# Patient Record
Sex: Male | Born: 1965 | Race: White | Hispanic: No | Marital: Married | Smoking: Never smoker
Health system: Southern US, Community
[De-identification: ages and names within clinical notes are randomized; demographics above are authoritative.]

## PROBLEM LIST (undated history)

## (undated) DIAGNOSIS — M25522 Pain in left elbow: Secondary | ICD-10-CM

## (undated) DIAGNOSIS — J309 Allergic rhinitis, unspecified: Secondary | ICD-10-CM

## (undated) DIAGNOSIS — H409 Unspecified glaucoma: Secondary | ICD-10-CM

## (undated) DIAGNOSIS — T7840XA Allergy, unspecified, initial encounter: Secondary | ICD-10-CM

## (undated) DIAGNOSIS — K219 Gastro-esophageal reflux disease without esophagitis: Secondary | ICD-10-CM

## (undated) DIAGNOSIS — K589 Irritable bowel syndrome without diarrhea: Secondary | ICD-10-CM

## (undated) HISTORY — DX: Unspecified glaucoma: H40.9

## (undated) HISTORY — DX: Allergic rhinitis, unspecified: J30.9

## (undated) HISTORY — DX: Allergy, unspecified, initial encounter: T78.40XA

## (undated) HISTORY — DX: Pain in left elbow: M25.522

## (undated) HISTORY — DX: Gastro-esophageal reflux disease without esophagitis: K21.9

---

## 2004-10-06 ENCOUNTER — Ambulatory Visit: Payer: Self-pay | Admitting: Internal Medicine

## 2005-05-11 ENCOUNTER — Ambulatory Visit: Payer: Self-pay | Admitting: Internal Medicine

## 2005-05-15 ENCOUNTER — Ambulatory Visit: Payer: Self-pay | Admitting: Internal Medicine

## 2005-07-24 ENCOUNTER — Ambulatory Visit: Payer: Self-pay | Admitting: Internal Medicine

## 2008-02-07 ENCOUNTER — Ambulatory Visit: Payer: Self-pay | Admitting: Internal Medicine

## 2008-02-07 DIAGNOSIS — M25569 Pain in unspecified knee: Secondary | ICD-10-CM | POA: Insufficient documentation

## 2009-04-24 ENCOUNTER — Ambulatory Visit: Payer: Self-pay | Admitting: Internal Medicine

## 2009-04-24 DIAGNOSIS — M25519 Pain in unspecified shoulder: Secondary | ICD-10-CM

## 2009-04-24 DIAGNOSIS — M7710 Lateral epicondylitis, unspecified elbow: Secondary | ICD-10-CM | POA: Insufficient documentation

## 2009-04-24 DIAGNOSIS — R079 Chest pain, unspecified: Secondary | ICD-10-CM

## 2009-04-24 DIAGNOSIS — M7711 Lateral epicondylitis, right elbow: Secondary | ICD-10-CM | POA: Insufficient documentation

## 2009-04-24 DIAGNOSIS — M21619 Bunion of unspecified foot: Secondary | ICD-10-CM

## 2009-05-04 DIAGNOSIS — M25522 Pain in left elbow: Secondary | ICD-10-CM

## 2009-05-04 HISTORY — DX: Pain in left elbow: M25.522

## 2009-06-17 ENCOUNTER — Ambulatory Visit: Payer: Self-pay | Admitting: Internal Medicine

## 2009-06-17 DIAGNOSIS — J0130 Acute sphenoidal sinusitis, unspecified: Secondary | ICD-10-CM | POA: Insufficient documentation

## 2009-06-17 DIAGNOSIS — Z87891 Personal history of nicotine dependence: Secondary | ICD-10-CM

## 2009-06-17 DIAGNOSIS — J209 Acute bronchitis, unspecified: Secondary | ICD-10-CM | POA: Insufficient documentation

## 2009-07-03 ENCOUNTER — Ambulatory Visit: Payer: Self-pay | Admitting: Internal Medicine

## 2009-08-01 ENCOUNTER — Telehealth: Payer: Self-pay | Admitting: Internal Medicine

## 2009-08-05 ENCOUNTER — Ambulatory Visit: Payer: Self-pay | Admitting: Internal Medicine

## 2009-08-05 DIAGNOSIS — M25529 Pain in unspecified elbow: Secondary | ICD-10-CM

## 2010-02-28 ENCOUNTER — Telehealth: Payer: Self-pay | Admitting: Internal Medicine

## 2010-04-07 ENCOUNTER — Encounter: Payer: Self-pay | Admitting: Internal Medicine

## 2010-04-07 ENCOUNTER — Ambulatory Visit: Payer: Self-pay | Admitting: Internal Medicine

## 2010-04-07 ENCOUNTER — Telehealth: Payer: Self-pay | Admitting: Internal Medicine

## 2010-04-07 DIAGNOSIS — M79609 Pain in unspecified limb: Secondary | ICD-10-CM

## 2010-05-14 ENCOUNTER — Telehealth: Payer: Self-pay | Admitting: Internal Medicine

## 2010-05-14 ENCOUNTER — Ambulatory Visit
Admission: RE | Admit: 2010-05-14 | Discharge: 2010-05-14 | Payer: Self-pay | Source: Home / Self Care | Attending: Internal Medicine | Admitting: Internal Medicine

## 2010-05-14 DIAGNOSIS — J069 Acute upper respiratory infection, unspecified: Secondary | ICD-10-CM | POA: Insufficient documentation

## 2010-06-03 NOTE — Assessment & Plan Note (Signed)
Summary: pain right elbow/add per sarah/cd   Vital Signs:  Patient profile:   45 year old male Height:      69 inches Weight:      178 pounds BMI:     26.38 O2 Sat:      98 % on Room air Temp:     97.1 degrees F oral Pulse rate:   56 / minute BP sitting:   128 / 82  (left arm) Cuff size:   regular  Vitals Entered By: Bill Salinas CMA (August 05, 2009 8:09 AM)  O2 Flow:  Room air  Procedure Note Last Tetanus: Td (02/07/2008)  Injections: The patient complains of pain. Indication: chronic pain Consent signed: yes  Procedure # 1: joint injection    Region: lateral    Location:  L elbow lat epic.    Technique: 24 g needle    Medication: 20 mg depomedrol    Anesthesia: 2.0 ml 1% lidocaine w/o epinephrine    Comment: Risks including but not limited by incomplete procedure, bleeding, infection, recurrence were discussed with the patient. Consent form was signed. Tolerated well. Complicatons - none. Good pain relief following the procedure.   Cleaned and prepped with: alcohol and betadine Wound dressing: bandaid and pressure dressing  CC: pt here with c/o pain in left elbow/ ab   CC:  pt here with c/o pain in left elbow/ ab.  History of Present Illness: C/o L elbow pain - bad w/exertion x weeks. Not better w/Pennsaid and Naproxen.  Current Medications (verified): 1)  Flexeril 5 Mg Tabs (Cyclobenzaprine Hcl) .Marland Kitchen.. 1 By Mouth Three Times A Day As Needed 2)  Naproxen 500 Mg Tabs (Naproxen) .Marland Kitchen.. 1 By Mouth Two Times A Day As Needed Pain 3)  Vitamin D 1000 Unit Tabs (Cholecalciferol) .Marland Kitchen.. 1 By Mouth Qd 4)  Promethazine-Codeine 6.25-10 Mg/14ml Syrp (Promethazine-Codeine) .... 5-10 Ml By Mouth Q Id As Needed Cough 5)  Pennsaid 1.5 % Soln (Diclofenac Sodium) .... 3-5 Gtt On Skin Three Times A Day For Pain 6)  Hydrocodone-Acetaminophen 5-325 Mg Tabs (Hydrocodone-Acetaminophen) .Marland Kitchen.. 1-2 By Mouth Two Times A Day As Needed Pain  Allergies: No Known Drug Allergies  Physical  Exam  General:  R handed NAD Msk:  L lat epic is tender Extremities:  No clubbing, cyanosis, edema, or deformity noted with normal full range of motion of all joints.   Neurologic:  No cranial nerve deficits noted. Station and gait are normal. Plantar reflexes are down-going bilaterally. DTRs are symmetrical throughout. Sensory, motor and coordinative functions appear intact.   Impression & Recommendations:  Problem # 1:  ELBOW PAIN (ICD-719.42) L Assessment Deteriorated Options to treat discussed. He wanted to have an injection done. Orders: Ace Wraps 3-5 in/yard  (K7425) Joint Aspirate / Injection, Intermediate (95638) Depo-Medrol 20mg  (J1020)  Complete Medication List: 1)  Flexeril 5 Mg Tabs (Cyclobenzaprine hcl) .Marland Kitchen.. 1 by mouth three times a day as needed 2)  Naproxen 500 Mg Tabs (Naproxen) .Marland Kitchen.. 1 by mouth two times a day as needed pain 3)  Vitamin D 1000 Unit Tabs (Cholecalciferol) .Marland Kitchen.. 1 by mouth qd 4)  Promethazine-codeine 6.25-10 Mg/75ml Syrp (Promethazine-codeine) .... 5-10 ml by mouth q id as needed cough 5)  Pennsaid 1.5 % Soln (Diclofenac sodium) .... 3-5 gtt on skin three times a day for pain 6)  Hydrocodone-acetaminophen 5-325 Mg Tabs (Hydrocodone-acetaminophen) .Marland Kitchen.. 1-2 by mouth two times a day as needed pain  Patient Instructions: 1)  Keep return office visit  2)  Call if you are not better in a reasonable amount of time or if worse.

## 2010-06-03 NOTE — Assessment & Plan Note (Signed)
Summary: foot & elbow pain/?steroid shot/cd--ok staci   Vital Signs:  Patient profile:   45 year old male Height:      69 inches Weight:      187 pounds BMI:     27.71 Temp:     98.3 degrees F oral Pulse rate:   76 / minute Pulse rhythm:   regular Resp:     16 per minute BP sitting:   110 / 80  (left arm) Cuff size:   regular  Vitals Entered By: Lanier Prude, Beverly Gust) (April 07, 2010 4:16 PM)  Procedure Note Last Tetanus: Td (02/07/2008)  Injections: The patient complains of pain and inflammation. Indication: chronic pain  Procedure # 1: joint injection    Region: lateral    Location: L elbow epycond    Technique: 25 g needle    Medication: 20 mg depomedrol    Anesthesia: 2.0 ml 1% lidocaine w/o epinephrine    Comment: Risks including but not limited by incomplete procedure, bleeding, infection, recurrence were discussed with the patient. Consent form was signed. Tolerated well. Complicatons - none. Good pain relief following the procedure.   Cleaned and prepped with: alcohol and betadine Wound dressing: bandaid  CC: Lt  elbow and Rt foot pain  Is Patient Diabetic? No Comments pt is not taking promethaz-codeine   CC:  Lt  elbow and Rt foot pain .  History of Present Illness: C/o R foot pain x 3 months in the big toe ong over midfoot on the outer side. He cut his foot bad on oyster shell 3-4 months ago C/o L elbow pain worse again  Current Medications (verified): 1)  Flexeril 5 Mg Tabs (Cyclobenzaprine Hcl) .Marland Kitchen.. 1 By Mouth Three Times A Day As Needed 2)  Naproxen 500 Mg Tabs (Naproxen) .Marland Kitchen.. 1 By Mouth Two Times A Day As Needed Pain 3)  Vitamin D 1000 Unit Tabs (Cholecalciferol) .Marland Kitchen.. 1 By Mouth Qd 4)  Promethazine-Codeine 6.25-10 Mg/45ml Syrp (Promethazine-Codeine) .... 5-10 Ml By Mouth Q Id As Needed Cough 5)  Pennsaid 1.5 % Soln (Diclofenac Sodium) .... 3-5 Gtt On Skin Three Times A Day For Pain 6)  Hydrocodone-Acetaminophen 5-325 Mg Tabs  (Hydrocodone-Acetaminophen) .Marland Kitchen.. 1-2 By Mouth Two Times A Day As Needed Pain  Allergies (verified): No Known Drug Allergies  Past History:  Past Surgical History: Last updated: 02/07/2008 Denies surgical history  Social History: Last updated: 06/17/2009 Occupation: Education officer, environmental; also Education administrator Married Never Smoked  Physical Exam  General:  R handed NAD Msk:  L elbow lat epic is very tender L 1st MTP is tender and prominent with scars healed Pulses:  R and L carotid,radial,femoral,dorsalis pedis and posterior tibial pulses are full and equal bilaterally Skin:  clear Psych:  Cognition and judgment appear intact. Alert and cooperative with normal attention span and concentration. No apparent delusions, illusions, hallucinations   Impression & Recommendations:  Problem # 1:  BUNION, RIGHT FOOT (ICD-727.1) L Assessment Deteriorated Xray Bedside US did not show Ca++ fragments  Problem # 2:  ELBOW PAIN (ICD-719.42) L Assessment: Deteriorated  Take 40mg  qd for 3 days, then 20 mg qd for 3 days, then 10mg  qd for 6 days, then stop. Take pc.   Orders: Joint Aspirate / Injection, Intermediate (16109) Depo-Medrol 20mg  (J1020)  Problem # 3:  FOOT PAIN (ICD-729.5) L Assessment: New  Orders: T-Foot Left Min 3 Views (73630TC)  Complete Medication List: 1)  Flexeril 5 Mg Tabs (Cyclobenzaprine hcl) .Marland Kitchen.. 1 by mouth three times a day  as needed 2)  Vitamin D 1000 Unit Tabs (Cholecalciferol) .Marland Kitchen.. 1 by mouth qd 3)  Promethazine-codeine 6.25-10 Mg/82ml Syrp (Promethazine-codeine) .... 5-10 ml by mouth q id as needed cough 4)  Pennsaid 1.5 % Soln (Diclofenac sodium) .... 3-5 gtt on skin three times a day for pain 5)  Hydrocodone-acetaminophen 5-325 Mg Tabs (Hydrocodone-acetaminophen) .Marland Kitchen.. 1-2 by mouth two times a day as needed pain 6)  Prednisone 10 Mg Tabs (Prednisone) .... Take 40mg  qd for 3 days, then 20 mg qd for 3 days, then 10mg  qd for 6 days, then stop. take pc.  Other  Orders: T-Foot Right (73630TC)  Patient Instructions: 1)  Please schedule a follow-up appointment in 1 month. Prescriptions: PREDNISONE 10 MG TABS (PREDNISONE) Take 40mg  qd for 3 days, then 20 mg qd for 3 days, then 10mg  qd for 6 days, then stop. Take pc.  #24 x 1   Entered and Authorized by:   Tresa Garter MD   Signed by:   Tresa Garter MD on 04/07/2010   Method used:   Print then Give to Patient   RxID:   [id number] PREDNISONE 10 MG TABS (PREDNISONE) Take 40mg  qd for 3 days, then 20 mg qd for 3 days, then 10mg  qd for 6 days, then stop. Take pc.  #24 x 1   Entered and Authorized by:   Tresa Garter MD   Signed by:   Tresa Garter MD on 04/07/2010   Method used:   Electronically to        [facility]* (retail)       [address] [place], [place]  [address]       Ph: [phone]       Fax: [phone]   RxID:   [id number]    Orders Added: 1)  Joint Aspirate / Injection, Intermediate [20605] 2)  Depo-Medrol 20mg  [J1020] 3)  Est. Patient Level III [64403] 4)  T-Foot Right [73630TC]

## 2010-06-03 NOTE — Assessment & Plan Note (Signed)
Summary: CONGESTION/ ACHES/ NO FEVER/ SORE THROAT/NWS   Vital Signs:  Patient profile:   45 year old male Weight:      172 pounds Temp:     98.4 degrees F oral Pulse rate:   64 / minute BP sitting:   124 / 84  (left arm)  Vitals Entered By: Tora Perches (June 17, 2009 11:12 AM) CC: cong. Is Patient Diabetic? No   CC:  cong.Marland Kitchen  History of Present Illness: The patient presents with complaints of sore throat, fever, cough, sinus congestion and drainge of several days duration. Not better with OTC meds. Chest hurts with coughing. Can't sleep due to cough. Muscle aches are present.  The mucus is colored.  Sweats. C/o L elbow pain - severe at times x wks  Preventive Screening-Counseling & Management  Alcohol-Tobacco     Smoking Status: never  Current Medications (verified): 1)  Flexeril 5 Mg Tabs (Cyclobenzaprine Hcl) .Marland Kitchen.. 1 By Mouth Three Times A Day As Needed 2)  Naproxen 500 Mg Tabs (Naproxen) .Marland Kitchen.. 1 By Mouth Two Times A Day As Needed Pain  Allergies (verified): No Known Drug Allergies  Past History:  Past Medical History: Last updated: 02/07/2008 Unremarkable  Social History: Last updated: 06/17/2009 Occupation: Education officer, environmental; also Education administrator Married Never Smoked  Social History: Occupation: Education officer, environmental; also Education administrator Married Never Smoked Smoking Status:  never  Physical Exam  General:  alert and well-developed.   Mouth:  Erythematous throat mucosa and intranasal erythema.  Lungs:  CTA Heart:  RRR Abdomen:  S/NT Msk:  L lat epic is very tender Skin:  clear   Impression & Recommendations:  Problem # 1:  BRONCHITIS, ACUTE (ICD-466.0) Assessment New  His updated medication list for this problem includes:    Augmentin 875-125 Mg Tabs (Amoxicillin-pot clavulanate) .Marland Kitchen... 1 by mouth bid    Promethazine-codeine 6.25-10 Mg/22ml Syrp (Promethazine-codeine) .Marland Kitchen... 5-10 ml by mouth q id as needed cough  Problem # 2:  SINUSITIS, ACUTE  (ICD-461.9) Assessment: New  His updated medication list for this problem includes:    Augmentin 875-125 Mg Tabs (Amoxicillin-pot clavulanate) .Marland Kitchen... 1 by mouth bid    Promethazine-codeine 6.25-10 Mg/68ml Syrp (Promethazine-codeine) .Marland Kitchen... 5-10 ml by mouth q id as needed cough  Problem # 3:  LATERAL EPICONDYLITIS, LEFT (ICD-726.32) Assessment: Unchanged Pennsaid RTC for inj if not better  Complete Medication List: 1)  Flexeril 5 Mg Tabs (Cyclobenzaprine hcl) .Marland Kitchen.. 1 by mouth three times a day as needed 2)  Naproxen 500 Mg Tabs (Naproxen) .Marland Kitchen.. 1 by mouth two times a day as needed pain 3)  Vitamin D 1000 Unit Tabs (Cholecalciferol) .Marland Kitchen.. 1 by mouth qd 4)  Augmentin 875-125 Mg Tabs (Amoxicillin-pot clavulanate) .Marland Kitchen.. 1 by mouth bid 5)  Promethazine-codeine 6.25-10 Mg/35ml Syrp (Promethazine-codeine) .... 5-10 ml by mouth q id as needed cough 6)  Pennsaid 1.5 % Soln (Diclofenac sodium) .... 3-5 gtt on skin three times a day for pain  Patient Instructions: 1)  Use over-the-counter medicines for "cold": Tylenol  650mg  or Advil 400mg  every 6 hours  for fever; Delsym or Robutussin for cough. Mucinex or Mucinex D for congestion. Ricola or Halls for sore throat. Office visit if not better or if worse. Prescriptions: PENNSAID 1.5 % SOLN (DICLOFENAC SODIUM) 3-5 gtt on skin three times a day for pain  #1 x 3   Entered and Authorized by:   Tresa Garter MD   Signed by:   Tresa Garter MD on 06/17/2009   Method used:  Print then Give to Patient   RxID:   [id number] PROMETHAZINE-CODEINE 6.25-10 MG/5ML SYRP (PROMETHAZINE-CODEINE) 5-10 ml by mouth q id as needed cough  #300 ml x 0   Entered and Authorized by:   Tresa Garter MD   Signed by:   Tresa Garter MD on 06/17/2009   Method used:   Print then Give to Patient   RxID:   [id number] AUGMENTIN 875-125 MG TABS (AMOXICILLIN-POT CLAVULANATE) 1 by mouth bid  #20 x 0   Entered and Authorized by:   Tresa Garter MD    Signed by:   Tresa Garter MD on 06/17/2009   Method used:   Print then Give to Patient   RxID:   [id number]

## 2010-06-03 NOTE — Progress Notes (Signed)
Summary: REFILL - Hydrocodone  Phone Note Refill Request Call back at Work Phone [phone] Call back at [facility] VM ON [facility] # Message from:  Patient  Refills Requested: Medication #1:  HYDROCODONE-ACETAMINOPHEN 5-325 MG TABS 1-2 by mouth two times a day as needed pain. To go to [facility]  Initial call taken by: Lamar Sprinkles, CMA,  February 28, 2010 12:38 PM  Follow-up for Phone Call        ok to ref Follow-up by: Tresa Garter MD,  February 28, 2010 3:08 PM  Additional Follow-up for Phone Call Additional follow up Details #1::        Pt informed  Additional Follow-up by: Lamar Sprinkles, CMA,  February 28, 2010 6:00 PM    Prescriptions: HYDROCODONE-ACETAMINOPHEN 5-325 MG TABS (HYDROCODONE-ACETAMINOPHEN) 1-2 by mouth two times a day as needed pain  #60 x 0   Entered by:   Lamar Sprinkles, CMA   Authorized by:   Tresa Garter MD   Signed by:   Lamar Sprinkles, CMA on 02/28/2010   Method used:   Telephoned to ...       [facility]* (retail)       [address] [place], [place]  [address]       Ph: [phone]       Fax: [phone]   RxID:   [id number]

## 2010-06-03 NOTE — Progress Notes (Signed)
Summary: pain med  Phone Note Call from Patient Call back at Work Phone [phone]   Summary of Call: Patient left message on triage that the meds he was given are not working and he is in pain. Patient would like alternative, mentions Hydrocodone. Please advise. Initial call taken by: Lucious Groves,  August 01, 2009 2:57 PM  Follow-up for Phone Call        I was able to get in touch with the patient and confirmed that he is calling in regards to meds given 06/2009. He states that hydrocodone has worked previously, but if MD recommends taking two Naproxen, then he will do that. Patient is aware that he may need office visit and states that he will still need something to get him by until he can come in b/c he works two jobs. Please advise. Follow-up by: Lucious Groves,  August 01, 2009 3:42 PM  Additional Follow-up for Phone Call Additional follow up Details #1::        OK Vicodin Rx OV - we will inject the elbow Additional Follow-up by: Tresa Garter MD,  August 01, 2009 9:40 PM    Additional Follow-up for Phone Call Additional follow up Details #2::    Pt informed  Follow-up by: Lamar Sprinkles, CMA,  August 02, 2009 2:08 PM  New/Updated Medications: HYDROCODONE-ACETAMINOPHEN 5-325 MG TABS (HYDROCODONE-ACETAMINOPHEN) 1-2 by mouth two times a day as needed pain Prescriptions: HYDROCODONE-ACETAMINOPHEN 5-325 MG TABS (HYDROCODONE-ACETAMINOPHEN) 1-2 by mouth two times a day as needed pain  #60 x 1   Entered by:   Lamar Sprinkles, CMA   Authorized by:   Tresa Garter MD   Signed by:   Lamar Sprinkles, CMA on 08/02/2009   Method used:   Telephoned to ...       [facility]* (retail)       [address] [place], [place]  [address]       Ph: [phone]       Fax: [phone]   RxID:   [id number]

## 2010-06-03 NOTE — Miscellaneous (Signed)
Summary: Elbow injection/[facility]  Elbow injection/[facility]   Imported By: Sherian Rein 08/14/2009 13:19:12  _____________________________________________________________________  External Attachment:    Type:   Image     Comment:   External Document

## 2010-06-03 NOTE — Progress Notes (Signed)
Summary: [facility] IN TODAY?  Phone Note Call from Patient   Summary of Call: Pt c/o elbow & foot pain - had steriod injection in april for sam. He is req to be seen this afternoon by Dr Macario Golds, OK?  Initial call taken by: Lamar Sprinkles, CMA,  April 07, 2010 9:28 AM  Follow-up for Phone Call        advised Cheryl(scheduler) OK per AVP. Follow-up by: Lanier Prude, [facility]),  April 07, 2010 10:27 AM

## 2010-06-03 NOTE — Miscellaneous (Signed)
Summary: Consent for Procedure / [facility]  Consent for Procedure / [facility]   Imported By: Lennie Odor 04/09/2010 15:29:21  _____________________________________________________________________  External Attachment:    Type:   Image     Comment:   External Document

## 2010-06-05 NOTE — Assessment & Plan Note (Signed)
Summary: FLU SYMPTOMS/NWS   Vital Signs:  Patient profile:   45 year old male Weight:      187 pounds (85 kg) O2 Sat:      95 % on Room air Temp:     98.4 degrees F (36.89 degrees C) oral Pulse rate:   60 / minute BP sitting:   120 / 80  (left arm) Cuff size:   large  Vitals Entered By: Orlan Leavens RMA (May 14, 2010 2:44 PM)  O2 Flow:  Room air CC: Flu symptoms?, URI symptoms Is Patient Diabetic? No Pain Assessment Patient in pain? no        Primary Care Provider:  Georgina Quint Plotnikov MD  CC:  Flu symptoms? and URI symptoms.  History of Present Illness:  URI Symptoms      This is a 45 year old man who presents with URI symptoms.  The symptoms began 4 days ago.  The severity is described as moderate.  improved with delsym last 48h.  The patient reports nasal congestion, sore throat, and dry cough, but denies purulent nasal discharge, productive cough, earache, and sick contacts.  The patient denies fever, stiff neck, dyspnea, and wheezing.  The patient also reports itchy throat, sneezing, and muscle aches.  The patient denies seasonal symptoms, response to antihistamine, headache, and severe fatigue.  The patient denies the following risk factors for Strep sinusitis: tooth pain, Strep exposure, and tender adenopathy.    Clinical Review Panels:  Immunizations   Last Tetanus Booster:  Td (02/07/2008)   Last Flu Vaccine:  Fluvax 3+ (02/07/2008)   Current Medications (verified): 1)  Flexeril 5 Mg Tabs (Cyclobenzaprine Hcl) .Marland Kitchen.. 1 By Mouth Three Times A Day As Needed 2)  Vitamin D 1000 Unit Tabs (Cholecalciferol) .Marland Kitchen.. 1 By Mouth Qd 3)  Pennsaid 1.5 % Soln (Diclofenac Sodium) .... 3-5 Gtt On Skin Three Times A Day For Pain 4)  Azithromycin 250 Mg Tabs (Azithromycin) .... 2 Tabs By Mouth Today, Then 1 By Mouth Daily Starting Tomorrow 5)  Hydrocodone-Acetaminophen 5-325 Mg Tabs (Hydrocodone-Acetaminophen) .... 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    Physical Exam  General:  alert, well-developed, well-nourished, and cooperative to examination.   nontoxic Head:  Normocephalic and atraumatic without obvious abnormalities. No apparent alopecia or balding. Eyes:  vision grossly intact; pupils equal, round and reactive to light.  conjunctiva and lids normal.    Ears:  R ear normal and L ear normal.   Mouth:  teeth and gums in good repair; mucous membranes moist, without lesions or ulcers. oropharynx clear without exudate, min erythema.  Lungs:  normal respiratory effort, no intercostal retractions or use of accessory muscles; normal breath sounds bilaterally - no crackles and no wheezes.    Heart:  normal rate, regular rhythm, no murmur, and no rub. BLE without edema.   Impression & Recommendations:  Problem # 1:  VIRAL URI (ICD-465.9) reassurance offered - paper rx for zpak provided "in [place]" symptoms worse (fevr, sputum or purulent nasal discharge)  Instructed on symptomatic treatment. Call if symptoms persist or worsen.   Complete Medication List: 1)  Flexeril 5 Mg Tabs (Cyclobenzaprine hcl) .Marland Kitchen.. 1 by mouth three times a day as needed 2)  Vitamin D 1000 Unit  Tabs (Cholecalciferol) .Marland Kitchen.. 1 by mouth qd 3)  Pennsaid 1.5 % Soln (Diclofenac sodium) .... 3-5 gtt on skin three times a day for pain 4)  Azithromycin 250 Mg Tabs (Azithromycin) .... 2 tabs by mouth today, then 1 by mouth daily starting tomorrow 5)  Hydrocodone-acetaminophen 5-325 Mg Tabs (Hydrocodone-acetaminophen) .... Take 1-2 by mouth once daily as needed  Patient Instructions: 1)  it was good to see you today. 2)  if you develop worsening symptoms or fever, call us and we can reconsider antibiotics but it does not  appear necessary to use any anitbiotic at this time (paper Zpak prescription given "in [place]" this occurs) 3)  Get plenty of rest, drink lots of clear liquids, and use Tylenol or Ibuprofen for fever and comfort. Return in 7-10 days if you're not better:sooner if you're feeling worse. Prescriptions: AZITHROMYCIN 250 MG TABS (AZITHROMYCIN) 2 tabs by mouth today, then 1 by mouth daily starting tomorrow  #6 x 0   Entered and Authorized by:   Newt Lukes MD   Signed by:   Newt Lukes MD on 05/14/2010   Method used:   Print then Give to Patient   RxID:   [id number]    Orders Added: 1)  Est. Patient Level III [86578]

## 2010-06-05 NOTE — Progress Notes (Signed)
Summary: REFILL  Phone Note Call from Patient   Caller: Patient Reason for Call: Refill Medication, Talk to Doctor Summary of Call: Pt is requesting refill on Hydrocodone 5/325. Pls [facility] pharmacy Initial call taken by: Orlan Leavens RMA,  May 14, 2010 3:04 PM  Follow-up for Phone Call        ok x1 Follow-up by: Tresa Garter MD,  May 14, 2010 5:31 PM  Additional Follow-up for Phone Call Additional follow up Details #1::        LEft detailed vm for pt on hm #, rx called in Additional Follow-up by: Lamar Sprinkles, CMA,  May 14, 2010 5:39 PM    Prescriptions: HYDROCODONE-ACETAMINOPHEN 5-325 MG TABS (HYDROCODONE-ACETAMINOPHEN) take 1-2 by mouth once daily as needed  #60 x 0   Entered by:   Lamar Sprinkles, CMA   Authorized by:   Tresa Garter MD   Signed by:   Lamar Sprinkles, CMA on 05/14/2010   Method used:   Telephoned to ...       [facility]* (retail)       [address] [place], [place]  [address]       Ph: [phone]       Fax: [phone]   RxID:   [id number]

## 2010-09-11 ENCOUNTER — Telehealth: Payer: Self-pay | Admitting: *Deleted

## 2010-09-11 NOTE — Telephone Encounter (Signed)
Patient requesting RX Rf for Hydrocodone 5/325 1-2 qd prn # 60 for tendonitis - last given 05/2010

## 2010-09-11 NOTE — Telephone Encounter (Signed)
OK to fill this prescription with additional refills x1 Thank you!  

## 2010-09-12 MED ORDER — HYDROCODONE-ACETAMINOPHEN 5-325 MG PO TABS: 1.0000 | ORAL_TABLET | Freq: Every day | ORAL | Status: DC

## 2010-09-12 NOTE — Telephone Encounter (Signed)
Rx called in 

## 2010-12-24 ENCOUNTER — Ambulatory Visit (INDEPENDENT_AMBULATORY_CARE_PROVIDER_SITE_OTHER): Payer: PRIVATE HEALTH INSURANCE | Admitting: Internal Medicine

## 2010-12-24 ENCOUNTER — Other Ambulatory Visit: Payer: Self-pay

## 2010-12-24 ENCOUNTER — Ambulatory Visit (INDEPENDENT_AMBULATORY_CARE_PROVIDER_SITE_OTHER)
Admission: RE | Admit: 2010-12-24 | Discharge: 2010-12-24 | Disposition: A | Discharge status: Home / Self Care | Payer: PRIVATE HEALTH INSURANCE | Source: Ambulatory Visit | Attending: Internal Medicine | Admitting: Internal Medicine

## 2010-12-24 ENCOUNTER — Other Ambulatory Visit: Payer: Self-pay | Admitting: *Deleted

## 2010-12-24 ENCOUNTER — Encounter: Payer: Self-pay | Admitting: Internal Medicine

## 2010-12-24 DIAGNOSIS — M7710 Lateral epicondylitis, unspecified elbow: Secondary | ICD-10-CM

## 2010-12-24 DIAGNOSIS — M25559 Pain in unspecified hip: Secondary | ICD-10-CM

## 2010-12-24 DIAGNOSIS — M25529 Pain in unspecified elbow: Secondary | ICD-10-CM

## 2010-12-24 DIAGNOSIS — M25551 Pain in right hip: Secondary | ICD-10-CM | POA: Insufficient documentation

## 2010-12-24 DIAGNOSIS — M25522 Pain in left elbow: Secondary | ICD-10-CM

## 2010-12-24 DIAGNOSIS — M25539 Pain in unspecified wrist: Secondary | ICD-10-CM

## 2010-12-24 MED ORDER — PREDNISONE 10 MG PO TABS: ORAL_TABLET | ORAL | Status: AC

## 2010-12-24 MED ORDER — HYDROCODONE-ACETAMINOPHEN 5-325 MG PO TABS: 1.0000 | ORAL_TABLET | Freq: Every day | ORAL | Status: DC

## 2010-12-24 MED ORDER — VITAMIN D 1000 UNITS PO TABS: 1000.0000 [IU] | ORAL_TABLET | Freq: Every day | ORAL | Status: AC

## 2010-12-24 NOTE — Telephone Encounter (Signed)
I corrected: Take 1-2 bid prn pain Thx

## 2010-12-24 NOTE — Assessment & Plan Note (Signed)
Hip stretch Prednisone 10 mg: take 4 tabs a day x 3 days; then 3 tabs a day x 4 days; then 2 tabs a day x 4 days, then 1 tab a day x 6 days, then stop. Take pc.

## 2010-12-24 NOTE — Patient Instructions (Signed)
Hip stretches; IT band stretches on [email or web address]

## 2010-12-24 NOTE — Telephone Encounter (Signed)
[facility] called regarding clarification on Norco rx. Is pt to take 1 tablet once daily or 1-2 tablets once daily as needed. Please advise

## 2010-12-24 NOTE — Assessment & Plan Note (Signed)
Rx as above

## 2010-12-24 NOTE — Progress Notes (Signed)
  Subjective:    Patient ID: Zachary George, male    DOB: 11-29-1965, 45 y.o.   MRN: [id number]  HPI C/o R hip pain after a fall at [employer] in July - he slipped on a floor. He has been having bad pain after sitting for a long time; moving is aggravating too. 8/10 when driving.  C/o L elbow recurrent pain  Review of Systems  Constitutional: Negative for appetite change, fatigue and unexpected weight change.  HENT: Negative for nosebleeds, congestion, sore throat, sneezing, trouble swallowing and neck pain.   Eyes: Negative for itching and visual disturbance.  Respiratory: Negative for cough.   Cardiovascular: Negative for chest pain, palpitations and leg swelling.  Gastrointestinal: Negative for nausea, diarrhea, blood in stool and abdominal distention.  Genitourinary: Negative for frequency and hematuria.  Musculoskeletal: Positive for arthralgias (L elbow and R hip pain). Negative for back pain, joint swelling and gait problem.  Skin: Negative for rash.  Neurological: Negative for dizziness, tremors, speech difficulty and weakness.  Psychiatric/Behavioral: Negative for sleep disturbance, dysphoric mood and agitation. The patient is not nervous/anxious.    Wt Readings from Last 3 Encounters:  12/24/10 186 lb (84.369 kg)  05/14/10 187 lb (84.823 kg)  04/07/10 187 lb (84.823 kg)       Objective:   Physical Exam  Constitutional: He is oriented to person, place, and time. He appears well-developed.  HENT:  Mouth/Throat: Oropharynx is clear and moist.  Eyes: Conjunctivae are normal. Pupils are equal, round, and reactive to light.  Neck: Normal range of motion. No JVD present. No thyromegaly present.  Cardiovascular: Normal rate, regular rhythm, normal heart sounds and intact distal pulses.  Exam reveals no gallop and no friction rub.   No murmur heard. Pulmonary/Chest: Effort normal and breath sounds normal. No respiratory distress. He has no wheezes. He has no rales. He exhibits no  tenderness.  Abdominal: Soft. Bowel sounds are normal. He exhibits no distension and no mass. There is no tenderness. There is no rebound and no guarding.  Musculoskeletal: Normal range of motion. He exhibits tenderness (L lat elbow is tenderR troch major of the hip is tender). He exhibits no edema.  Lymphadenopathy:    He has no cervical adenopathy.  Neurological: He is alert and oriented to person, place, and time. He has normal reflexes. No cranial nerve deficit. He exhibits normal muscle tone. Coordination normal.  Skin: Skin is warm and dry. No rash noted.  Psychiatric: He has a normal mood and affect. His behavior is normal. Judgment and thought content normal.          Assessment & Plan:  Procedure Note :    Procedure :   Point of care (POC) sonography examination   Indication: L elbow pain   Equipment used: Sonosite M-Turbo with HFL38x/13-6 MHz transducer linear probe. The images were stored in the unit and later transferred in storage.  The patient was placed in a decubitus position.  This study revealed a hyperechoic  small lesion in the proximal extensor carpi radialis c/w calcification.   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

## 2010-12-25 NOTE — Telephone Encounter (Signed)
Pharmacist at [facility] informed of correct signature.

## 2010-12-26 ENCOUNTER — Telehealth: Payer: Self-pay | Admitting: Internal Medicine

## 2010-12-26 NOTE — Telephone Encounter (Signed)
Misty Stanley, please, inform patient that his hip xray was ok - no fx Thx

## 2010-12-26 NOTE — Telephone Encounter (Signed)
Pt informed

## 2010-12-29 NOTE — Assessment & Plan Note (Addendum)
Severe pain. Options to treat discussed. Will inject in 1 mo if not better

## 2011-07-09 ENCOUNTER — Telehealth: Payer: Self-pay | Admitting: *Deleted

## 2011-07-09 DIAGNOSIS — Z125 Encounter for screening for malignant neoplasm of prostate: Secondary | ICD-10-CM

## 2011-07-09 DIAGNOSIS — Z0000 Encounter for general adult medical examination without abnormal findings: Secondary | ICD-10-CM

## 2011-07-09 NOTE — Telephone Encounter (Signed)
Message copied by Merrilyn Puma on Thu Jul 09, 2011  1:42 PM ------      Message from: Newell Coral      Created: Mon Jun 15, 2011  2:06 PM      Regarding: cpe sche, needs labs       The pt has scheduled their cpe for the end of April and is hoping to get labs before then.  Thanks!

## 2011-07-09 NOTE — Telephone Encounter (Signed)
Labs entered.

## 2011-08-31 ENCOUNTER — Encounter: Payer: Self-pay | Admitting: Internal Medicine

## 2011-08-31 ENCOUNTER — Ambulatory Visit (INDEPENDENT_AMBULATORY_CARE_PROVIDER_SITE_OTHER): Payer: PRIVATE HEALTH INSURANCE | Admitting: Internal Medicine

## 2011-08-31 VITALS — BP 136/90 | HR 76 | Temp 97.8°F | Resp 16 | Wt 194.0 lb

## 2011-08-31 DIAGNOSIS — R350 Frequency of micturition: Secondary | ICD-10-CM

## 2011-08-31 DIAGNOSIS — N320 Bladder-neck obstruction: Secondary | ICD-10-CM

## 2011-08-31 DIAGNOSIS — Z0000 Encounter for general adult medical examination without abnormal findings: Secondary | ICD-10-CM | POA: Insufficient documentation

## 2011-08-31 DIAGNOSIS — K219 Gastro-esophageal reflux disease without esophagitis: Secondary | ICD-10-CM

## 2011-08-31 DIAGNOSIS — G43909 Migraine, unspecified, not intractable, without status migrainosus: Secondary | ICD-10-CM

## 2011-08-31 DIAGNOSIS — M25529 Pain in unspecified elbow: Secondary | ICD-10-CM

## 2011-08-31 DIAGNOSIS — M25522 Pain in left elbow: Secondary | ICD-10-CM

## 2011-08-31 DIAGNOSIS — H409 Unspecified glaucoma: Secondary | ICD-10-CM | POA: Insufficient documentation

## 2011-08-31 MED ORDER — HYDROCODONE-ACETAMINOPHEN 5-325 MG PO TABS: 1.0000 | ORAL_TABLET | Freq: Every day | ORAL | Status: DC

## 2011-08-31 MED ORDER — PANTOPRAZOLE SODIUM 40 MG PO TBEC: 40.0000 mg | DELAYED_RELEASE_TABLET | Freq: Every day | ORAL | Status: DC

## 2011-08-31 MED ORDER — IBUPROFEN 600 MG PO TABS: ORAL_TABLET | ORAL | Status: AC

## 2011-08-31 NOTE — Patient Instructions (Signed)
Loose weight

## 2011-08-31 NOTE — Assessment & Plan Note (Signed)
2014  Check labs

## 2011-08-31 NOTE — Assessment & Plan Note (Signed)
Protonix prn 

## 2011-08-31 NOTE — Assessment & Plan Note (Signed)
2014 Dr Hazle Quant

## 2011-08-31 NOTE — Progress Notes (Signed)
  Subjective:    Patient ID: Zachary George, male    DOB: 14-Mar-1966, 46 y.o.   MRN: [id number]  HPI  The patient is here for a wellness exam. The patient has been doing well overall without major physical or psychological issues going on lately. The patient needs to address chronic recurrent L elbow pain. C/o GERD. C/o urinary frequency at times C/o wt gain C/o occ HAs  BP Readings from Last 3 Encounters:  08/31/11 136/90  12/24/10 112/80  05/14/10 120/80   Wt Readings from Last 3 Encounters:  08/31/11 194 lb (87.998 kg)  12/24/10 186 lb (84.369 kg)  05/14/10 187 lb (84.823 kg)      Review of Systems  Constitutional: Positive for fatigue and unexpected weight change. Negative for chills and appetite change.  HENT: Negative for nosebleeds, congestion, sore throat, sneezing, trouble swallowing and neck pain.   Eyes: Negative for itching and visual disturbance.  Respiratory: Negative for cough.   Cardiovascular: Negative for chest pain, palpitations and leg swelling.  Gastrointestinal: Negative for nausea, diarrhea, blood in stool and abdominal distention.  Genitourinary: Positive for frequency. Negative for urgency, hematuria, decreased urine volume and penile pain.  Musculoskeletal: Negative for back pain, joint swelling and gait problem.  Skin: Negative for rash.  Neurological: Negative for dizziness, tremors, speech difficulty, weakness and numbness.  Psychiatric/Behavioral: Negative for suicidal ideas, behavioral problems, sleep disturbance, dysphoric mood and agitation. The patient is not nervous/anxious.        Objective:   Physical Exam  Constitutional: He is oriented to person, place, and time. He appears well-developed. No distress.       obese  HENT:  Head: Normocephalic and atraumatic.  Right Ear: External ear normal.  Left Ear: External ear normal.  Nose: Nose normal.  Mouth/Throat: Oropharynx is clear and moist. No oropharyngeal exudate.  Eyes: Conjunctivae  and EOM are normal. Pupils are equal, round, and reactive to light. Right eye exhibits no discharge. Left eye exhibits no discharge. No scleral icterus.  Neck: Normal range of motion. Neck supple. No JVD present. No tracheal deviation present. No thyromegaly present.  Cardiovascular: Normal rate, regular rhythm, normal heart sounds and intact distal pulses.  Exam reveals no gallop and no friction rub.   No murmur heard. Pulmonary/Chest: Effort normal and breath sounds normal. No stridor. No respiratory distress. He has no wheezes. He has no rales. He exhibits no tenderness.  Abdominal: Soft. Bowel sounds are normal. He exhibits no distension and no mass. There is no tenderness. There is no rebound and no guarding.  Genitourinary: Rectum normal, prostate normal and penis normal. Guaiac negative stool. No penile tenderness.  Musculoskeletal: Normal range of motion. He exhibits no edema and no tenderness.  Lymphadenopathy:    He has no cervical adenopathy.  Neurological: He is alert and oriented to person, place, and time. He has normal reflexes. No cranial nerve deficit. He exhibits normal muscle tone. Coordination normal.  Skin: Skin is warm and dry. No rash noted. He is not diaphoretic. No erythema. No pallor.  Psychiatric: He has a normal mood and affect. His behavior is normal. Judgment and thought content normal.    Glu 92    Assessment & Plan:

## 2011-08-31 NOTE — Assessment & Plan Note (Signed)
We discussed age appropriate health related issues, including available/recomended screening tests and vaccinations. We discussed a need for adhering to healthy diet and exercise. Labs/EKG were reviewed/ordered. All questions were answered.   

## 2011-08-31 NOTE — Assessment & Plan Note (Signed)
Recurrent, work related "tennis elbow" Ice Hydroc/APAP prn

## 2011-08-31 NOTE — Assessment & Plan Note (Signed)
Infrequent  Ibuprofen prn

## 2011-09-01 ENCOUNTER — Telehealth: Payer: Self-pay | Admitting: Internal Medicine

## 2011-09-01 ENCOUNTER — Other Ambulatory Visit (INDEPENDENT_AMBULATORY_CARE_PROVIDER_SITE_OTHER): Payer: PRIVATE HEALTH INSURANCE

## 2011-09-01 DIAGNOSIS — R350 Frequency of micturition: Secondary | ICD-10-CM

## 2011-09-01 DIAGNOSIS — Z0000 Encounter for general adult medical examination without abnormal findings: Secondary | ICD-10-CM

## 2011-09-01 DIAGNOSIS — G43909 Migraine, unspecified, not intractable, without status migrainosus: Secondary | ICD-10-CM

## 2011-09-01 DIAGNOSIS — N320 Bladder-neck obstruction: Secondary | ICD-10-CM

## 2011-09-01 DIAGNOSIS — H409 Unspecified glaucoma: Secondary | ICD-10-CM

## 2011-09-01 LAB — CBC WITH DIFFERENTIAL/PLATELET
Basophils Relative: 0.3 % (ref 0.0–3.0)
Eosinophils Relative: 3 % (ref 0.0–5.0)
HCT: 48.9 % (ref 39.0–52.0)
Hemoglobin: 16.6 g/dL (ref 13.0–17.0)
Lymphs Abs: 1.7 10*3/uL (ref 0.7–4.0)
MCV: 87.9 fl (ref 78.0–100.0)
Monocytes Absolute: 0.5 10*3/uL (ref 0.1–1.0)
Neutro Abs: 4.1 10*3/uL (ref 1.4–7.7)
Platelets: 220 10*3/uL (ref 150.0–400.0)
WBC: 6.5 10*3/uL (ref 4.5–10.5)

## 2011-09-01 LAB — URINALYSIS
Leukocytes, UA: NEGATIVE
Nitrite: NEGATIVE
Total Protein, Urine: NEGATIVE
pH: 5.5 (ref 5.0–8.0)

## 2011-09-01 LAB — HEPATIC FUNCTION PANEL
Alkaline Phosphatase: 69 U/L (ref 39–117)
Bilirubin, Direct: 0.1 mg/dL (ref 0.0–0.3)
Total Bilirubin: 0.9 mg/dL (ref 0.3–1.2)

## 2011-09-01 LAB — LIPID PANEL
LDL Cholesterol: 128 mg/dL — ABNORMAL HIGH (ref 0–99)
Total CHOL/HDL Ratio: 4
VLDL: 6.8 mg/dL (ref 0.0–40.0)

## 2011-09-01 LAB — BASIC METABOLIC PANEL
BUN: 16 mg/dL (ref 6–23)
Chloride: 105 mEq/L (ref 96–112)
Potassium: 4.4 mEq/L (ref 3.5–5.1)
Sodium: 139 mEq/L (ref 135–145)

## 2011-09-01 LAB — TSH: TSH: 1.5 u[IU]/mL (ref 0.35–5.50)

## 2011-09-01 NOTE — Telephone Encounter (Signed)
Zachary George, please, inform patient that all labs are normal except for a little elev glu: loose wt and cont to exercise  Please, mail the labs to the patient.    Thx

## 2011-09-02 ENCOUNTER — Telehealth: Payer: Self-pay | Admitting: *Deleted

## 2011-09-02 NOTE — Telephone Encounter (Signed)
Rec letter from [facility]. Requesting we send a letter to pt's ins so that he may be evaluated/treated by Dr. Hazle Quant. Letter typed/signed and mailed to address provided on letter to pt's ins.

## 2011-09-02 NOTE — Telephone Encounter (Signed)
Pt informed/ copies mailed.  

## 2011-09-10 ENCOUNTER — Encounter: Payer: Self-pay | Admitting: *Deleted

## 2011-09-22 ENCOUNTER — Telehealth: Payer: Self-pay | Admitting: *Deleted

## 2011-09-22 NOTE — Telephone Encounter (Signed)
Pt informed

## 2011-09-22 NOTE — Telephone Encounter (Signed)
I tried calling pt to inform of below. No answer. Left mess for patient to call back.   Caller stating pt should be seeing a [employer] in [place] instead of Dr. Hazle Quant for treatment of his glaucoma.The information and clinicals have been reviewed by the medical director and they are denying the coverage. She gave me the names of 3 in network providers that the pt can see: [facility]@ [phone], Dr. Dione Booze @ [phone] and Dr. Jill Poling (not sure of ph number).

## 2011-11-30 IMAGING — CR DG HIP COMPLETE 2+V*R*
3 series · 3 of 3 positions shown · non-contrast
Comparison: None

CLINICAL DATA: Hip pain

RIGHT HIP - COMPLETE 2+ VIEW

[view not recorded (1 of 3)]
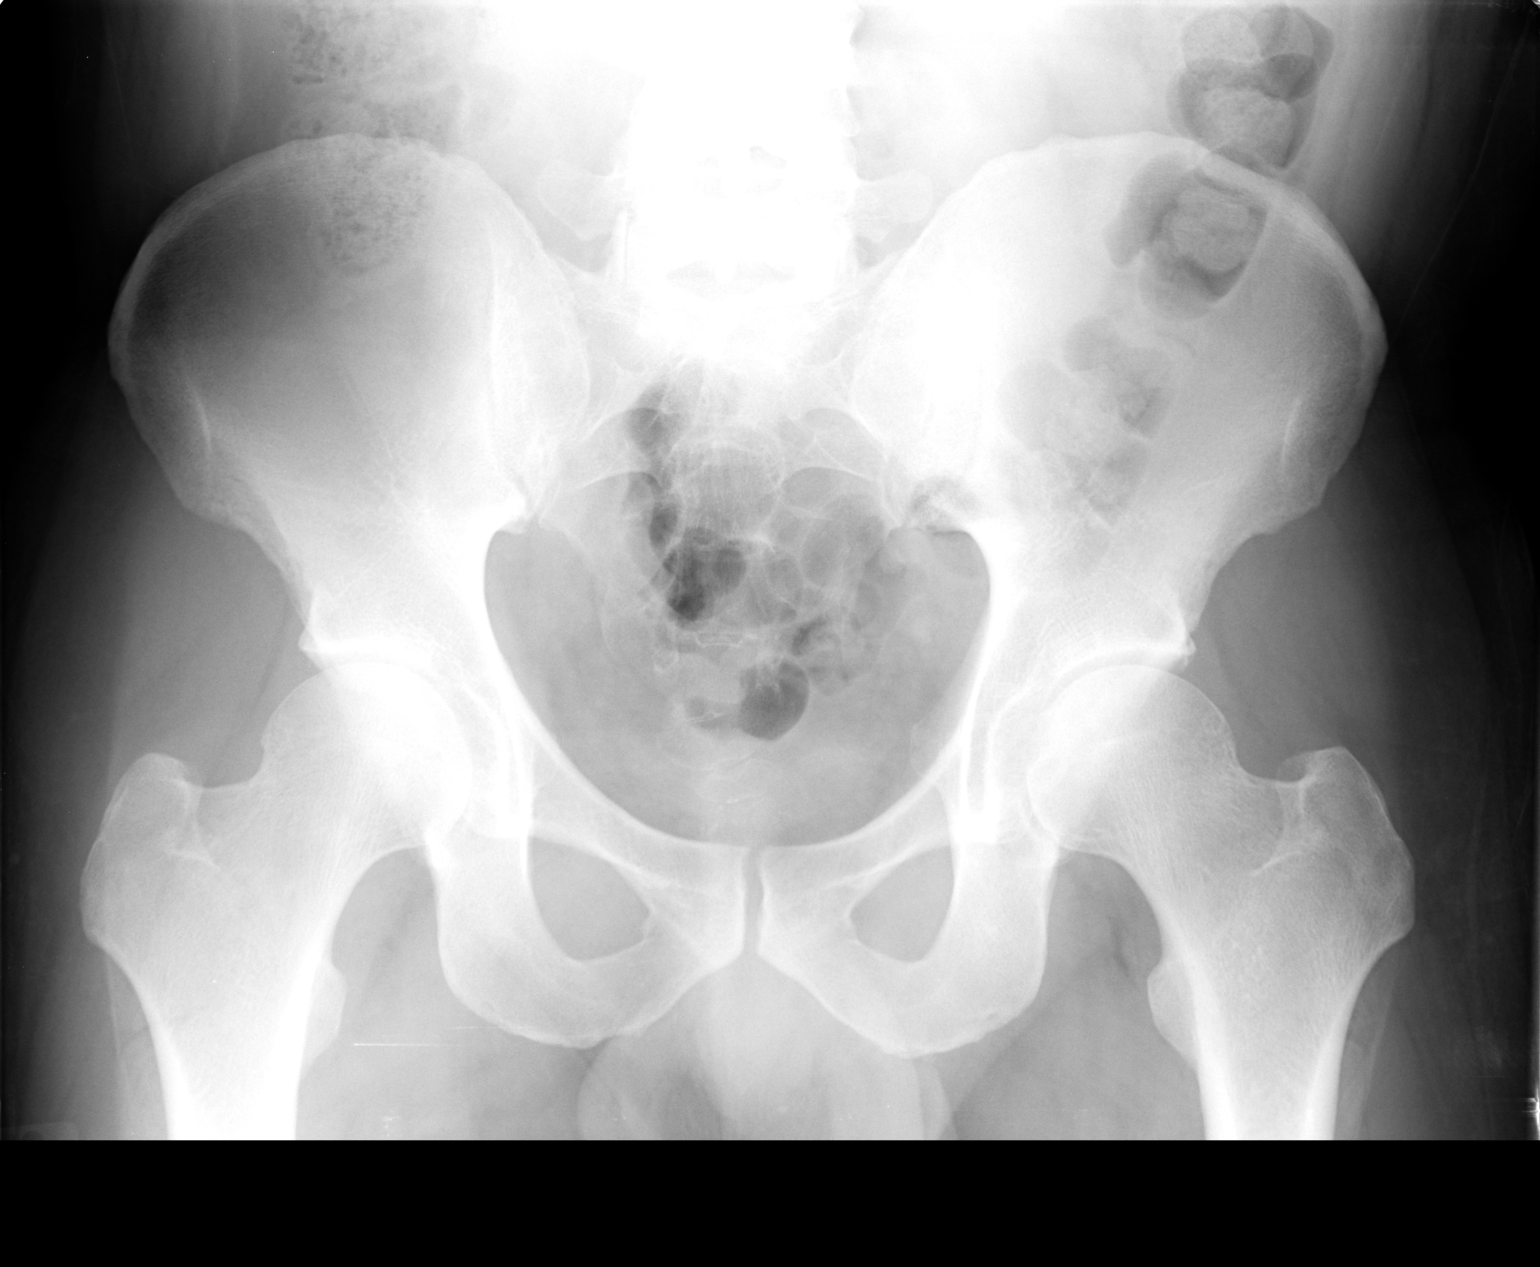

[view not recorded (2 of 3)]
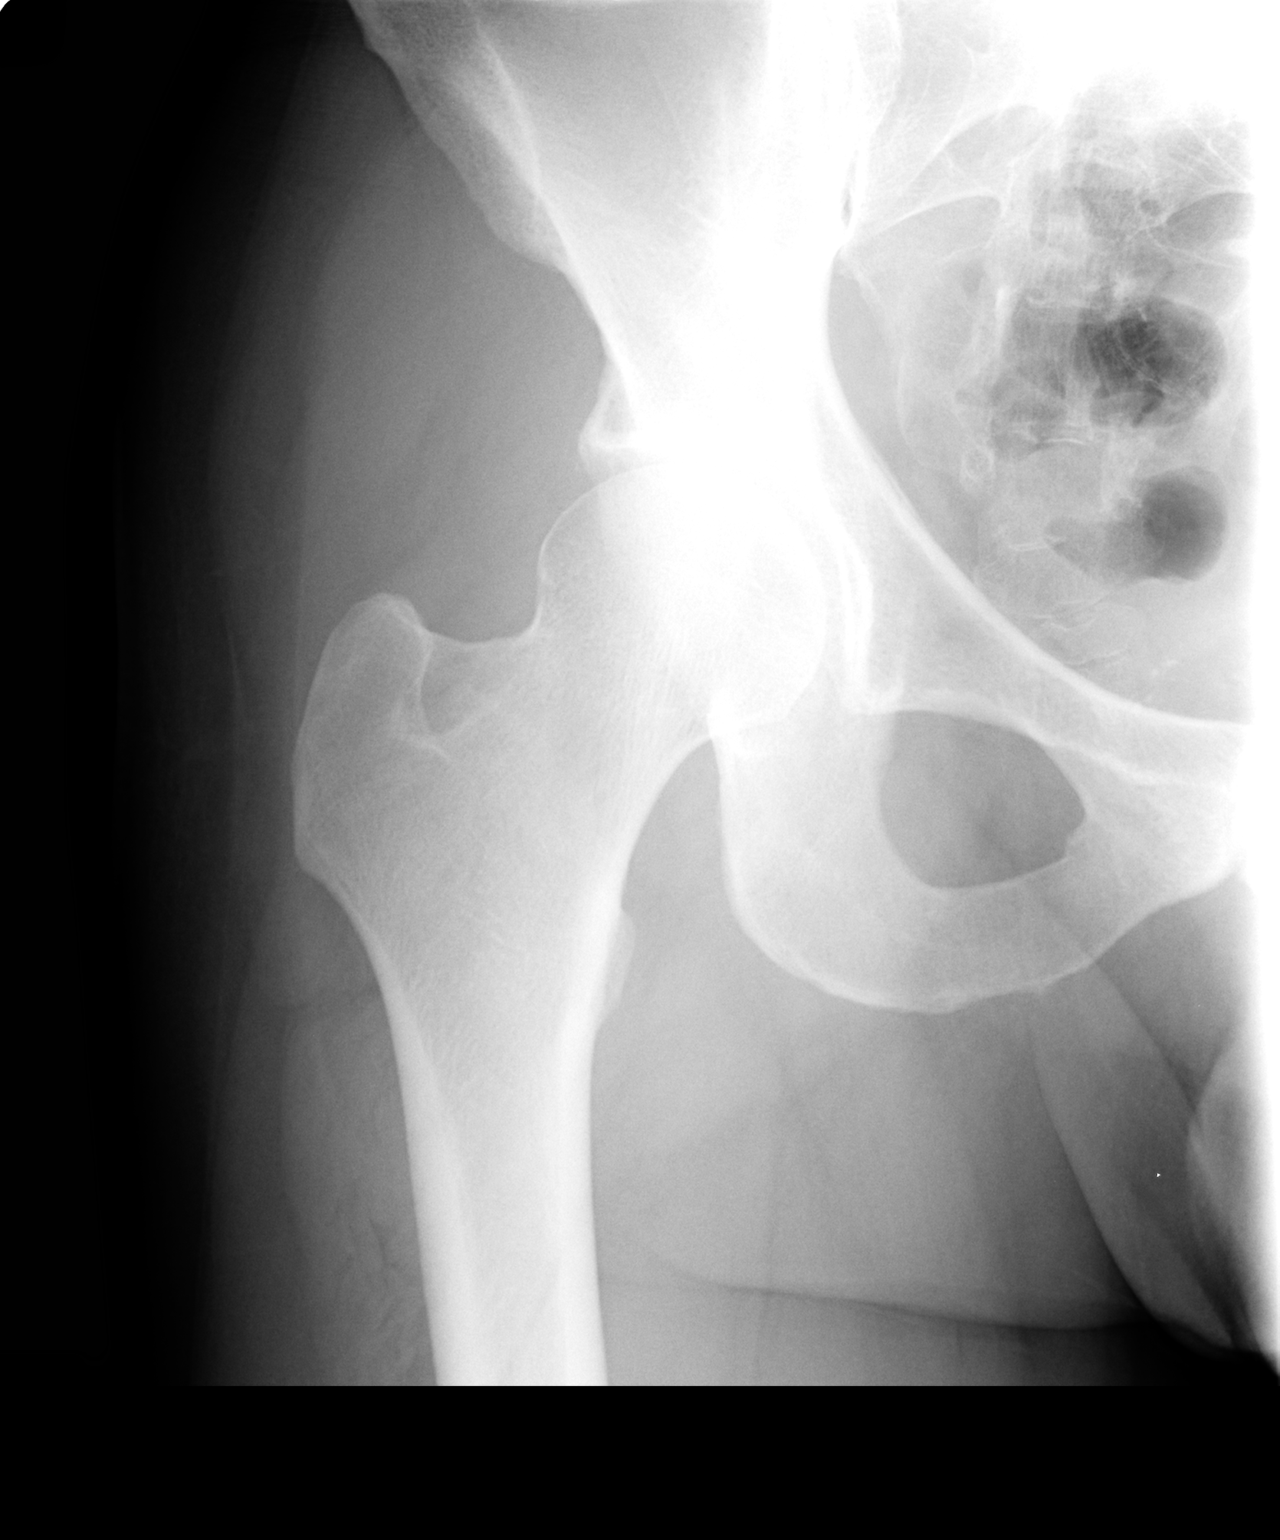

[view not recorded (3 of 3)]
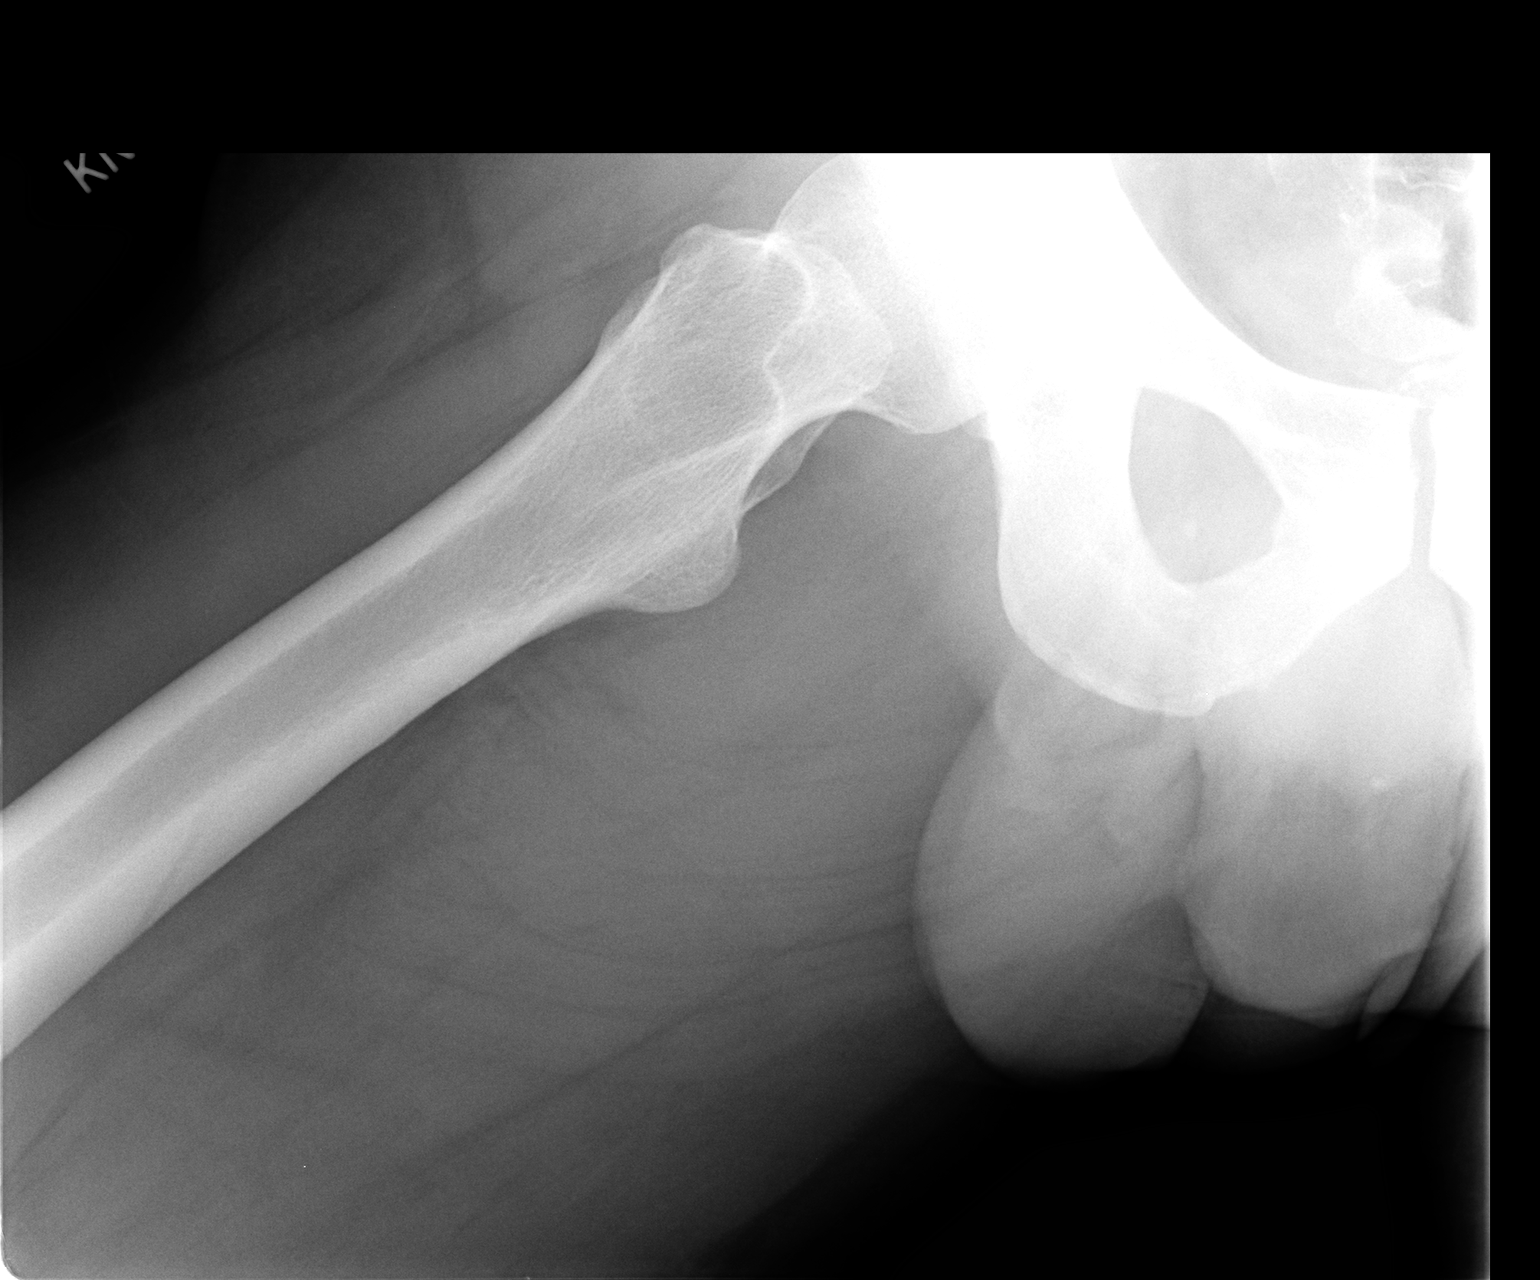

[3 of 3 positions shown; findings below may reference images not displayed]

FINDINGS: Bones of the pelvis are normal.  Sacroiliac joints and
symphysis pubis are normal.  Both hip joints show normal joint
space.  No marginal osteophytes are other focal lesions.
IMPRESSION: Normal radiographs

## 2012-05-09 ENCOUNTER — Telehealth: Payer: Self-pay | Admitting: Internal Medicine

## 2012-05-09 NOTE — Telephone Encounter (Signed)
Patient is requesting a refill on Hydrocodone, call when ready for pick up

## 2012-05-10 MED ORDER — HYDROCODONE-ACETAMINOPHEN 5-325 MG PO TABS: 1.0000 | ORAL_TABLET | Freq: Every day | ORAL | Status: DC

## 2012-05-10 NOTE — Telephone Encounter (Signed)
OK to fill this prescription with additional refills x0 Thank you!  

## 2012-05-10 NOTE — Telephone Encounter (Signed)
faxed to pharmacy, patient aware

## 2012-05-10 NOTE — Telephone Encounter (Signed)
Rx printed- signed and faxed to pharmacy.

## 2012-07-07 ENCOUNTER — Ambulatory Visit (INDEPENDENT_AMBULATORY_CARE_PROVIDER_SITE_OTHER): Payer: PRIVATE HEALTH INSURANCE | Admitting: Internal Medicine

## 2012-07-07 ENCOUNTER — Encounter: Payer: Self-pay | Admitting: Internal Medicine

## 2012-07-07 VITALS — BP 132/86 | HR 66 | Temp 97.7°F | Ht 69.0 in | Wt 199.0 lb

## 2012-07-07 DIAGNOSIS — T148XXA Other injury of unspecified body region, initial encounter: Secondary | ICD-10-CM

## 2012-07-07 MED ORDER — IBUPROFEN 600 MG PO TABS: 600.0000 mg | ORAL_TABLET | Freq: Three times a day (TID) | ORAL | Status: DC | PRN

## 2012-07-07 MED ORDER — METAXALONE 800 MG PO TABS: 800.0000 mg | ORAL_TABLET | Freq: Three times a day (TID) | ORAL | Status: DC

## 2012-07-11 ENCOUNTER — Encounter: Payer: Self-pay | Admitting: Internal Medicine

## 2012-07-11 NOTE — Patient Instructions (Signed)
Muscle Strain °Muscle strain occurs when a muscle is stretched beyond its normal length. A small number of muscle fibers generally are torn. This is especially common in athletes. This happens when a sudden, violent force placed on a muscle stretches it too far. Usually, recovery from muscle strain takes 1 to 2 weeks. Complete healing will take 5 to 6 weeks.  °HOME CARE INSTRUCTIONS  °· While awake, apply ice to the sore muscle for the first 2 days after the injury. °· Put ice in a plastic bag. °· Place a towel between your skin and the bag. °· Leave the ice on for 15 to 20 minutes each hour. °· Do not use the strained muscle for several days, until you no longer have pain. °· You may wrap the injured area with an elastic bandage for comfort. Be careful not to wrap it too tightly. This may interfere with blood circulation or increase swelling. °· Only take over-the-counter or prescription medicines for pain, discomfort, or fever as directed by your caregiver. °SEEK MEDICAL CARE IF:  °You have increasing pain or swelling in the injured area. °MAKE SURE YOU:  °· Understand these instructions. °· Will watch your condition. °· Will get help right away if you are not doing well or get worse. °Document Released: 04/20/2005 Document Revised: 07/13/2011 Document Reviewed: 05/02/2011 °[employer]® Patient Information ©2013 [employer]. ° °

## 2012-07-11 NOTE — Progress Notes (Signed)
Subjective:    Patient ID: Zachary George, male    DOB: 11/09/1965, 47 y.o.   MRN: [id number]  HPI  Pt presents to the clinic today with c/o pain under his right shoulder blade. This started 2 days ago. Now, if he reaches forward for something, or twist in a certain way, the pain is very intense. He has taken Norco which has not really helped. He denies any injury to the area that he can recall. He denies pain in the neck or numbness and tingling into the hands.  Review of Systems      Past Medical History  Diagnosis Date  . Left elbow pain 2011    tennis elbow    Current Outpatient Prescriptions  Medication Sig Dispense Refill  . HYDROcodone-acetaminophen (NORCO/VICODIN) 5-325 MG per tablet Take 1 tablet by mouth daily. Take 1-2 bid prn pain  100 tablet  0  . pantoprazole (PROTONIX) 40 MG tablet Take 1 tablet (40 mg total) by mouth daily.  30 tablet  11  . ibuprofen (ADVIL,MOTRIN) 600 MG tablet Take 1 tablet (600 mg total) by mouth every 8 (eight) hours as needed for pain.  30 tablet  0  . metaxalone (SKELAXIN) 800 MG tablet Take 1 tablet (800 mg total) by mouth 3 (three) times daily.  30 tablet  0   No current facility-administered medications for this visit.    No Known Allergies  History reviewed. No pertinent family history.  History   Social History  . Marital Status: Married    Spouse Name: N/A    Number of Children: N/A  . Years of Education: N/A   Occupational History  . Not on file.   Social History Main Topics  . Smoking status: Never Smoker   . Smokeless tobacco: Not on file  . Alcohol Use: No  . Drug Use: No  . Sexually Active: Yes   Other Topics Concern  . Not on file   Social History Narrative  . No narrative on file     Constitutional: Denies fever, malaise, fatigue, headache or abrupt weight changes.  Musculoskeletal: Pt reports pain under his right shoulder. Denies decrease in range of motion, difficulty with gait or joint pain and swelling.   Neurological: Denies numbness and tingling in the hands, dizziness, difficulty with memory, difficulty with speech or problems with balance and coordination.   No other specific complaints in a complete review of systems (except as listed in HPI above).  Objective:   Physical Exam    BP 132/86  Pulse 66  Temp(Src) 97.7 F (36.5 C) (Oral)  Ht 5\' 9"  (1.753 m)  Wt 199 lb (90.266 kg)  BMI 29.37 kg/m2  SpO2 97% Wt Readings from Last 3 Encounters:  07/07/12 199 lb (90.266 kg)  08/31/11 194 lb (87.998 kg)  12/24/10 186 lb (84.369 kg)    General: Appears his stated age, well developed, well nourished in NAD. Cardiovascular: Normal rate and rhythm. S1,S2 noted.  No murmur, rubs or gallops noted. No JVD or BLE edema. No carotid bruits noted. Pulmonary/Chest: Normal effort and positive vesicular breath sounds. No respiratory distress. No wheezes, rales or ronchi noted.  Musculoskeletal: Pinpoint tenderness along the lateral edge of the trapezius on the right side. Normal range of motion. No signs of joint swelling. No difficulty with gait.  Neurological: Alert and oriented. Cranial nerves II-XII intact. Coordination normal. +DTRs bilaterally.      Assessment & Plan:   Muscle strain, new onset:  Do stretching exercises as  shown o handout eRx for skelaxin prn eRx for ibuprofem 800 mg  RTC as needed or if symptoms persist

## 2012-09-15 ENCOUNTER — Other Ambulatory Visit: Payer: Self-pay | Admitting: Internal Medicine

## 2012-10-11 ENCOUNTER — Telehealth: Payer: Self-pay | Admitting: *Deleted

## 2012-10-11 NOTE — Telephone Encounter (Signed)
Left msg on triage requesting a refill on his hydrocodone. pls send to [facility] on [address]...Raechel Chute

## 2012-10-11 NOTE — Telephone Encounter (Signed)
OK to fill this prescription with additional refills x0 Sch ROV Thank you!  

## 2012-10-12 NOTE — Telephone Encounter (Signed)
RX called into pharmacy

## 2013-01-04 ENCOUNTER — Other Ambulatory Visit: Payer: Self-pay | Admitting: Internal Medicine

## 2013-01-12 ENCOUNTER — Ambulatory Visit (INDEPENDENT_AMBULATORY_CARE_PROVIDER_SITE_OTHER): Payer: PRIVATE HEALTH INSURANCE | Admitting: Internal Medicine

## 2013-01-12 ENCOUNTER — Encounter: Payer: Self-pay | Admitting: Internal Medicine

## 2013-01-12 VITALS — BP 138/80 | HR 80 | Temp 98.1°F | Resp 16 | Wt 190.0 lb

## 2013-01-12 DIAGNOSIS — K589 Irritable bowel syndrome without diarrhea: Secondary | ICD-10-CM

## 2013-01-12 DIAGNOSIS — K602 Anal fissure, unspecified: Secondary | ICD-10-CM | POA: Insufficient documentation

## 2013-01-12 DIAGNOSIS — K921 Melena: Secondary | ICD-10-CM

## 2013-01-12 MED ORDER — TRIAMCINOLONE ACETONIDE 0.5 % EX OINT: TOPICAL_OINTMENT | Freq: Two times a day (BID) | CUTANEOUS | Status: DC

## 2013-01-12 MED ORDER — HYDROCORTISONE ACETATE 25 MG RE SUPP: 25.0000 mg | Freq: Two times a day (BID) | RECTAL | Status: DC

## 2013-01-12 NOTE — Assessment & Plan Note (Signed)
RTC 6 wks  He will need a colonoscopy

## 2013-01-12 NOTE — Progress Notes (Signed)
  Subjective:    HPI  C/o rectal pain w/defecation, bleeding x 1 mo Stools 1-3/d, soft C/o IBS - years  BP Readings from Last 3 Encounters:  01/12/13 138/80  07/07/12 132/86  08/31/11 136/90   Wt Readings from Last 3 Encounters:  01/12/13 190 lb (86.183 kg)  07/07/12 199 lb (90.266 kg)  08/31/11 194 lb (87.998 kg)      Review of Systems  Constitutional: Positive for fatigue and unexpected weight change. Negative for chills and appetite change.  HENT: Negative for nosebleeds, congestion, sore throat, sneezing, trouble swallowing and neck pain.   Eyes: Negative for itching and visual disturbance.  Respiratory: Negative for cough.   Cardiovascular: Negative for chest pain, palpitations and leg swelling.  Gastrointestinal: Negative for nausea, diarrhea, blood in stool and abdominal distention.  Genitourinary: Positive for frequency. Negative for urgency, hematuria, decreased urine volume and penile pain.  Musculoskeletal: Negative for back pain, joint swelling and gait problem.  Skin: Negative for rash.  Neurological: Negative for dizziness, tremors, speech difficulty, weakness and numbness.  Psychiatric/Behavioral: Negative for suicidal ideas, behavioral problems, sleep disturbance, dysphoric mood and agitation. The patient is not nervous/anxious.        Objective:   Physical Exam  Constitutional: He is oriented to person, place, and time. He appears well-developed. No distress.  obese  HENT:  Head: Normocephalic and atraumatic.  Right Ear: External ear normal.  Left Ear: External ear normal.  Nose: Nose normal.  Mouth/Throat: Oropharynx is clear and moist. No oropharyngeal exudate.  Eyes: Conjunctivae and EOM are normal. Pupils are equal, round, and reactive to light. Right eye exhibits no discharge. Left eye exhibits no discharge. No scleral icterus.  Neck: Normal range of motion. Neck supple. No JVD present. No tracheal deviation present. No thyromegaly present.   Cardiovascular: Normal rate, regular rhythm, normal heart sounds and intact distal pulses.  Exam reveals no gallop and no friction rub.   No murmur heard. Pulmonary/Chest: Effort normal and breath sounds normal. No stridor. No respiratory distress. He has no wheezes. He has no rales. He exhibits no tenderness.  Abdominal: Soft. Bowel sounds are normal. He exhibits no distension and no mass. There is no tenderness. There is no rebound and no guarding.  Genitourinary: Rectum normal, prostate normal and penis normal. Guaiac negative stool. No penile tenderness.  Musculoskeletal: Normal range of motion. He exhibits no edema and no tenderness.  Lymphadenopathy:    He has no cervical adenopathy.  Neurological: He is alert and oriented to person, place, and time. He has normal reflexes. No cranial nerve deficit. He exhibits normal muscle tone. Coordination normal.  Skin: Skin is warm and dry. No rash noted. He is not diaphoretic. No erythema. No pallor.  Psychiatric: He has a normal mood and affect. His behavior is normal. Judgment and thought content normal.  11 o'clock fissure ?1cm Rectal not done due to pain      Assessment & Plan:

## 2013-01-12 NOTE — Assessment & Plan Note (Signed)
Triamc oint Anusol HC RTC 6 wks

## 2013-01-12 NOTE — Patient Instructions (Addendum)
Use wet wipes Call if not well  Gluten free trial (no wheat products) for 4-6 weeks. OK to use gluten-free bread and gluten-free pasta.  Milk free trial (no milk, ice cream, cheese and yogurt) for 4-6 weeks. OK to use almond, coconut, rice or soy milk. "Almond breeze" brand tastes good.

## 2013-01-12 NOTE — Assessment & Plan Note (Signed)
Gluten free trial (no wheat products) for 4-6 weeks. OK to use gluten-free bread and gluten-free pasta.  Milk free trial (no milk, ice cream, cheese and yogurt) for 4-6 weeks. OK to use almond, coconut, rice or soy milk. "Almond breeze" brand tastes good.  

## 2013-01-25 ENCOUNTER — Encounter: Payer: PRIVATE HEALTH INSURANCE | Admitting: Internal Medicine

## 2013-02-10 ENCOUNTER — Encounter: Payer: PRIVATE HEALTH INSURANCE | Admitting: Internal Medicine

## 2013-03-07 ENCOUNTER — Other Ambulatory Visit: Payer: Self-pay | Admitting: Internal Medicine

## 2013-04-24 ENCOUNTER — Encounter: Payer: Self-pay | Admitting: Family Medicine

## 2013-04-24 ENCOUNTER — Ambulatory Visit (INDEPENDENT_AMBULATORY_CARE_PROVIDER_SITE_OTHER): Payer: PRIVATE HEALTH INSURANCE | Admitting: Family Medicine

## 2013-04-24 VITALS — BP 158/95 | HR 68 | Temp 98.4°F | Ht 69.0 in | Wt 192.5 lb

## 2013-04-24 DIAGNOSIS — J019 Acute sinusitis, unspecified: Secondary | ICD-10-CM

## 2013-04-24 MED ORDER — HYDROCODONE-HOMATROPINE 5-1.5 MG/5ML PO SYRP: ORAL_SOLUTION | ORAL | Status: DC

## 2013-04-24 MED ORDER — AZITHROMYCIN 250 MG PO TABS: ORAL_TABLET | ORAL | Status: DC

## 2013-04-24 NOTE — Progress Notes (Signed)
OFFICE NOTE  04/24/2013  CC: No chief complaint on file.    HPI: Patient is a 47 y.o. Caucasian male who is here for cough. Onset of nasal cong/sneezing/runny nose 1 wk ago, then gradual onset of cough and it has worsened. Hacky cough, worse at night and keeps him up a lot at night.  Some mucinex helps minimally. Chest feels tight some, +fatigue.  No fever.  No wheezing.  Pertinent PMH:  Nonsmoker. GERD Chronic joint pain  MEDS:  Outpatient Prescriptions Prior to Visit  Medication Sig Dispense Refill  . HYDROcodone-acetaminophen (NORCO/VICODIN) 5-325 MG per tablet Take 1 tablet by mouth daily. Take 1-2 bid prn pain  100 tablet  0  . hydrocortisone (ANUSOL-HC) 25 MG suppository Place 1 suppository (25 mg total) rectally 2 (two) times daily.  20 suppository  1  . metaxalone (SKELAXIN) 800 MG tablet Take 1 tablet (800 mg total) by mouth 3 (three) times daily.  30 tablet  0  . pantoprazole (PROTONIX) 40 MG tablet TAKE ONE TABLET BY MOUTH ONE TIME DAILY   30 tablet  5  . ibuprofen (ADVIL,MOTRIN) 600 MG tablet Take 1 tablet (600 mg total) by mouth every 8 (eight) hours as needed for pain.  30 tablet  0  . triamcinolone ointment (KENALOG) 0.5 % Apply topically 2 (two) times daily.  60 g  1   No facility-administered medications prior to visit.    PE: Blood pressure 158/95, pulse 68, temperature 98.4 F (36.9 C), temperature source Temporal, height 5\' 9"  (1.753 m), weight 192 lb 8 oz (87.317 kg), SpO2 97.00%. VS: noted--normal. Gen: alert, NAD, NONTOXIC APPEARING. HEENT: eyes without injection, drainage, or swelling.  Ears: EACs clear, TMs with normal light reflex and landmarks.  Nose: Clear rhinorrhea, with some dried, crusty exudate adherent to mildly injected and edematous mucosa.  No purulent d/c.  Diffuse paranasal sinus TTP, worse in right ethmoid region.  No facial swelling.  Throat and mouth without focal lesion.  No pharyngial swelling, erythema, or exudate.   Neck: supple, no  LAD.   LUNGS: CTA bilat, nonlabored resps.  Aeration is excellent.  No prolonged exp phase. CV: RRR, no m/r/g. EXT: no c/c/e SKIN: no rash  LAB: none  IMPRESSION AND PLAN:  Acute sinusitis with PND cough.   Azithromycin x 5d.  Nasal saline spray. Hycodan for hs use.  FOLLOW UP: prn

## 2013-04-24 NOTE — Progress Notes (Signed)
Pre-visit discussion using our clinic review tool. No additional management support is needed unless otherwise documented below in the visit note.  

## 2013-04-24 NOTE — Patient Instructions (Signed)
Buy OTC nasal saline spray and use this for nasal irrigation.

## 2013-04-25 ENCOUNTER — Telehealth: Payer: Self-pay | Admitting: *Deleted

## 2013-04-25 NOTE — Telephone Encounter (Signed)
Pt called states the cough medication is not effective.  States it just keeps him awake does not have an effect on the cough.  Please advise

## 2013-04-25 NOTE — Telephone Encounter (Signed)
D/c Hycodan Pls call in Prom - cod syr Thx

## 2013-04-26 MED ORDER — PROMETHAZINE-CODEINE 6.25-10 MG/5ML PO SYRP: 5.0000 mL | ORAL_SOLUTION | ORAL | Status: DC | PRN

## 2013-04-26 NOTE — Telephone Encounter (Signed)
Left message on VM advising pt of MDs message

## 2013-05-01 ENCOUNTER — Ambulatory Visit (INDEPENDENT_AMBULATORY_CARE_PROVIDER_SITE_OTHER)
Admission: RE | Admit: 2013-05-01 | Discharge: 2013-05-01 | Disposition: A | Discharge status: Home / Self Care | Payer: PRIVATE HEALTH INSURANCE | Source: Ambulatory Visit | Attending: Internal Medicine | Admitting: Internal Medicine

## 2013-05-01 ENCOUNTER — Telehealth: Payer: Self-pay | Admitting: Internal Medicine

## 2013-05-01 ENCOUNTER — Encounter: Payer: Self-pay | Admitting: Internal Medicine

## 2013-05-01 ENCOUNTER — Ambulatory Visit (INDEPENDENT_AMBULATORY_CARE_PROVIDER_SITE_OTHER): Payer: PRIVATE HEALTH INSURANCE | Admitting: Internal Medicine

## 2013-05-01 VITALS — BP 150/100 | HR 80 | Temp 97.3°F | Resp 16 | Wt 193.0 lb

## 2013-05-01 DIAGNOSIS — R05 Cough: Secondary | ICD-10-CM | POA: Insufficient documentation

## 2013-05-01 DIAGNOSIS — R059 Cough, unspecified: Secondary | ICD-10-CM

## 2013-05-01 DIAGNOSIS — J209 Acute bronchitis, unspecified: Secondary | ICD-10-CM

## 2013-05-01 MED ORDER — BENZONATATE 200 MG PO CAPS: 200.0000 mg | ORAL_CAPSULE | Freq: Two times a day (BID) | ORAL | Status: DC | PRN

## 2013-05-01 MED ORDER — FLUTICASONE FUROATE-VILANTEROL 100-25 MCG/INH IN AEPB: 1.0000 | INHALATION_SPRAY | Freq: Every day | RESPIRATORY_TRACT | Status: DC

## 2013-05-01 MED ORDER — LEVOFLOXACIN 500 MG PO TABS: 500.0000 mg | ORAL_TABLET | Freq: Every day | ORAL | Status: DC

## 2013-05-01 MED ORDER — HYDROCODONE-ACETAMINOPHEN 5-325 MG PO TABS: 1.0000 | ORAL_TABLET | Freq: Every day | ORAL | Status: DC

## 2013-05-01 NOTE — Progress Notes (Signed)
Pre visit review using our clinic review tool, if applicable. No additional management support is needed unless otherwise documented below in the visit note. 

## 2013-05-01 NOTE — Assessment & Plan Note (Signed)
CXR See Rx

## 2013-05-01 NOTE — Patient Instructions (Signed)
Use over-the-counter  "cold" medicines  such as  "Afrin" nasal spray for nasal congestion as directed instead. Use" Delsym" or" Robitussin" cough syrup varietis for cough.  You can use plain "Tylenol" or "Advi"l for fever, chills and achyness.   "Common cold" symptoms are usually triggered by a virus.  The antibiotics are usually not necessary. On average, a" viral cold" illness would take 4-7 days to resolve. Please, make an appointment if you are not better or if you're worse.  

## 2013-05-01 NOTE — Telephone Encounter (Signed)
Ok to cont Advil prn Thx

## 2013-05-01 NOTE — Telephone Encounter (Signed)
Patient would like to know if he can continue to take advil.

## 2013-05-01 NOTE — Progress Notes (Signed)
   Subjective:    URI  This is a new problem. The current episode started 1 to 4 weeks ago (2 wks). The problem has been unchanged. There has been no fever. Associated symptoms include congestion and coughing. Pertinent negatives include no abdominal pain, chest pain, diarrhea, nausea, neck pain, rash, sneezing or sore throat. He has tried decongestant for the symptoms. The treatment provided no relief.    C/o URI x 2 wks     BP Readings from Last 3 Encounters:  05/01/13 150/100  04/24/13 158/95  01/12/13 138/80   Wt Readings from Last 3 Encounters:  05/01/13 193 lb (87.544 kg)  04/24/13 192 lb 8 oz (87.317 kg)  01/12/13 190 lb (86.183 kg)      Review of Systems  Constitutional: Positive for fatigue and unexpected weight change. Negative for chills and appetite change.  HENT: Positive for congestion. Negative for nosebleeds, sneezing, sore throat and trouble swallowing.   Eyes: Negative for itching and visual disturbance.  Respiratory: Positive for cough.   Cardiovascular: Negative for chest pain, palpitations and leg swelling.  Gastrointestinal: Negative for nausea, abdominal pain, diarrhea, blood in stool and abdominal distention.  Genitourinary: Positive for frequency. Negative for urgency, hematuria, decreased urine volume and penile pain.  Musculoskeletal: Negative for back pain, gait problem, joint swelling and neck pain.  Skin: Negative for rash.  Neurological: Negative for dizziness, tremors, speech difficulty, weakness and numbness.  Psychiatric/Behavioral: Negative for suicidal ideas, behavioral problems, sleep disturbance, dysphoric mood and agitation. The patient is not nervous/anxious.        Objective:   Physical Exam  Constitutional: He is oriented to person, place, and time. He appears well-developed. No distress.  obese  HENT:  Head: Normocephalic and atraumatic.  Right Ear: External ear normal.  Left Ear: External ear normal.  Nose: Nose normal.   Mouth/Throat: No oropharyngeal exudate.  eryth throat  Eyes: Conjunctivae and EOM are normal. Pupils are equal, round, and reactive to light. Right eye exhibits no discharge. Left eye exhibits no discharge. No scleral icterus.  Neck: Normal range of motion. Neck supple. No JVD present. No tracheal deviation present. No thyromegaly present.  Cardiovascular: Normal rate, regular rhythm, normal heart sounds and intact distal pulses.  Exam reveals no gallop and no friction rub.   No murmur heard. Pulmonary/Chest: Effort normal and breath sounds normal. No stridor. No respiratory distress. He has no wheezes. He has no rales. He exhibits no tenderness.  Abdominal: Soft. Bowel sounds are normal. He exhibits no distension and no mass. There is no tenderness. There is no rebound and no guarding.  Genitourinary: Rectum normal, prostate normal and penis normal. Guaiac negative stool. No penile tenderness.  Musculoskeletal: Normal range of motion. He exhibits no edema and no tenderness.  Lymphadenopathy:    He has no cervical adenopathy.  Neurological: He is alert and oriented to person, place, and time. He has normal reflexes. No cranial nerve deficit. He exhibits normal muscle tone. Coordination normal.  Skin: Skin is warm and dry. No rash noted. He is not diaphoretic. No erythema. No pallor.  Psychiatric: He has a normal mood and affect. His behavior is normal. Judgment and thought content normal.    I personally provided Breo inhaler use teaching. After the teaching patient was able to demonstrate it's use effectively. All questions were answered     Assessment & Plan:

## 2013-05-02 NOTE — Telephone Encounter (Signed)
Left detailed mess informing pt of below.  

## 2013-05-22 ENCOUNTER — Encounter: Payer: Self-pay | Admitting: Internal Medicine

## 2013-05-22 ENCOUNTER — Other Ambulatory Visit (INDEPENDENT_AMBULATORY_CARE_PROVIDER_SITE_OTHER): Payer: PRIVATE HEALTH INSURANCE

## 2013-05-22 ENCOUNTER — Ambulatory Visit (INDEPENDENT_AMBULATORY_CARE_PROVIDER_SITE_OTHER): Payer: PRIVATE HEALTH INSURANCE | Admitting: Internal Medicine

## 2013-05-22 VITALS — BP 160/104 | HR 76 | Temp 97.0°F | Resp 16 | Ht 69.0 in | Wt 193.0 lb

## 2013-05-22 DIAGNOSIS — Z0000 Encounter for general adult medical examination without abnormal findings: Secondary | ICD-10-CM

## 2013-05-22 DIAGNOSIS — H409 Unspecified glaucoma: Secondary | ICD-10-CM | POA: Insufficient documentation

## 2013-05-22 LAB — URINALYSIS
Hgb urine dipstick: NEGATIVE
Ketones, ur: NEGATIVE
Leukocytes, UA: NEGATIVE
Nitrite: NEGATIVE
PH: 5.5 (ref 5.0–8.0)
Total Protein, Urine: NEGATIVE
Urine Glucose: NEGATIVE
Urobilinogen, UA: 0.2 (ref 0.0–1.0)

## 2013-05-22 LAB — CBC WITH DIFFERENTIAL/PLATELET
Basophils Absolute: 0 10*3/uL (ref 0.0–0.1)
Basophils Relative: 0.2 % (ref 0.0–3.0)
Eosinophils Absolute: 0.2 10*3/uL (ref 0.0–0.7)
Eosinophils Relative: 2.6 % (ref 0.0–5.0)
HEMATOCRIT: 47.7 % (ref 39.0–52.0)
Hemoglobin: 16.5 g/dL (ref 13.0–17.0)
LYMPHS ABS: 2.3 10*3/uL (ref 0.7–4.0)
Lymphocytes Relative: 30.6 % (ref 12.0–46.0)
MCHC: 34.6 g/dL (ref 30.0–36.0)
MCV: 85.3 fl (ref 78.0–100.0)
MONOS PCT: 5.7 % (ref 3.0–12.0)
Monocytes Absolute: 0.4 10*3/uL (ref 0.1–1.0)
NEUTROS ABS: 4.7 10*3/uL (ref 1.4–7.7)
Neutrophils Relative %: 60.9 % (ref 43.0–77.0)
Platelets: 230 10*3/uL (ref 150.0–400.0)
RBC: 5.59 Mil/uL (ref 4.22–5.81)
RDW: 13.6 % (ref 11.5–14.6)
WBC: 7.6 10*3/uL (ref 4.5–10.5)

## 2013-05-22 LAB — BASIC METABOLIC PANEL
BUN: 14 mg/dL (ref 6–23)
CO2: 28 meq/L (ref 19–32)
Calcium: 9.7 mg/dL (ref 8.4–10.5)
Chloride: 103 mEq/L (ref 96–112)
Creatinine, Ser: 1 mg/dL (ref 0.4–1.5)
GFR: 84.59 mL/min (ref >60.00)
GLUCOSE: 95 mg/dL (ref 70–99)
POTASSIUM: 4.7 meq/L (ref 3.5–5.1)
Sodium: 139 mEq/L (ref 135–145)

## 2013-05-22 LAB — HEPATIC FUNCTION PANEL
ALT: 43 U/L (ref 0–53)
AST: 24 U/L (ref 0–37)
Albumin: 4.5 g/dL (ref 3.5–5.2)
Alkaline Phosphatase: 69 U/L (ref 39–117)
Bilirubin, Direct: 0.1 mg/dL (ref 0.0–0.3)
Total Bilirubin: 0.9 mg/dL (ref 0.3–1.2)
Total Protein: 7.8 g/dL (ref 6.0–8.3)

## 2013-05-22 LAB — LIPID PANEL
CHOL/HDL RATIO: 4
CHOLESTEROL: 217 mg/dL — AB (ref 0–200)
HDL: 60.1 mg/dL (ref >39.00)
Triglycerides: 48 mg/dL (ref 0.0–149.0)
VLDL: 9.6 mg/dL (ref 0.0–40.0)

## 2013-05-22 LAB — TSH: TSH: 1.3 u[IU]/mL (ref 0.35–5.50)

## 2013-05-22 LAB — LDL CHOLESTEROL, DIRECT: Direct LDL: 156 mg/dL

## 2013-05-22 MED ORDER — PANTOPRAZOLE SODIUM 40 MG PO TBEC: DELAYED_RELEASE_TABLET | ORAL | Status: DC

## 2013-05-22 NOTE — Progress Notes (Signed)
Pre visit review using our clinic review tool, if applicable. No additional management support is needed unless otherwise documented below in the visit note. 

## 2013-05-22 NOTE — Progress Notes (Signed)
Subjective:     HPI  The patient is here for a wellness exam.  The patient has been doing well overall without major physical or psychological issues going on lately. URI is better. F/u GERD.  The patient needs to address chronic recurrent L elbow pain. C/o elev BP.   BP Readings from Last 3 Encounters:  05/22/13 160/104  05/01/13 150/100  04/24/13 158/95   Wt Readings from Last 3 Encounters:  05/22/13 193 lb (87.544 kg)  05/01/13 193 lb (87.544 kg)  04/24/13 192 lb 8 oz (87.317 kg)      Review of Systems  Constitutional: Positive for fatigue and unexpected weight change. Negative for chills and appetite change.  HENT: Negative for congestion, nosebleeds, sneezing, sore throat and trouble swallowing.   Eyes: Negative for itching and visual disturbance.  Respiratory: Negative for cough.   Cardiovascular: Negative for chest pain, palpitations and leg swelling.  Gastrointestinal: Negative for nausea, diarrhea, blood in stool and abdominal distention.  Genitourinary: Positive for frequency. Negative for urgency, hematuria, decreased urine volume and penile pain.  Musculoskeletal: Negative for back pain, gait problem, joint swelling and neck pain.  Skin: Negative for rash.  Neurological: Negative for dizziness, tremors, speech difficulty, weakness and numbness.  Psychiatric/Behavioral: Negative for suicidal ideas, behavioral problems, sleep disturbance, dysphoric mood and agitation. The patient is not nervous/anxious.        Objective:   Physical Exam  Constitutional: He is oriented to person, place, and time. He appears well-developed. No distress.  obese  HENT:  Head: Normocephalic and atraumatic.  Right Ear: External ear normal.  Left Ear: External ear normal.  Nose: Nose normal.  Mouth/Throat: Oropharynx is clear and moist. No oropharyngeal exudate.  Eyes: Conjunctivae and EOM are normal. Pupils are equal, round, and reactive to light. Right eye exhibits no  discharge. Left eye exhibits no discharge. No scleral icterus.  Neck: Normal range of motion. Neck supple. No JVD present. No tracheal deviation present. No thyromegaly present.  Cardiovascular: Normal rate, regular rhythm, normal heart sounds and intact distal pulses.  Exam reveals no gallop and no friction rub.   No murmur heard. Pulmonary/Chest: Effort normal and breath sounds normal. No stridor. No respiratory distress. He has no wheezes. He has no rales. He exhibits no tenderness.  Abdominal: Soft. Bowel sounds are normal. He exhibits no distension and no mass. There is no tenderness. There is no rebound and no guarding.  Genitourinary: Rectum normal, prostate normal and penis normal. Guaiac negative stool. No penile tenderness.  Musculoskeletal: Normal range of motion. He exhibits no edema and no tenderness.  Lymphadenopathy:    He has no cervical adenopathy.  Neurological: He is alert and oriented to person, place, and time. He has normal reflexes. No cranial nerve deficit. He exhibits normal muscle tone. Coordination normal.  Skin: Skin is warm and dry. No rash noted. He is not diaphoretic. No erythema. No pallor.  Psychiatric: He has a normal mood and affect. His behavior is normal. Judgment and thought content normal.   Lab Results  Component Value Date   WBC 6.5 09/01/2011   HGB 16.6 09/01/2011   HCT 48.9 09/01/2011   PLT 220.0 09/01/2011   GLUCOSE 100* 09/01/2011   CHOL 187 09/01/2011   TRIG 34.0 09/01/2011   HDL 52.70 09/01/2011   LDLCALC 128* 09/01/2011   ALT 34 09/01/2011   AST 23 09/01/2011   NA 139 09/01/2011   K 4.4 09/01/2011   CL 105 09/01/2011   CREATININE 1.0  09/01/2011   BUN 16 09/01/2011   CO2 27 09/01/2011   TSH 1.50 09/01/2011   PSA 1.45 09/01/2011       Assessment & Plan:

## 2013-05-22 NOTE — Patient Instructions (Signed)
   Milk free trial (no milk, ice cream, cheese and yogurt) for 4-6 weeks. OK to use almond, coconut, rice milk. "Almond breeze" brand tastes good.  

## 2013-05-22 NOTE — Assessment & Plan Note (Signed)
We discussed age appropriate health related issues, including available/recomended screening tests and vaccinations. We discussed a need for adhering to healthy diet and exercise. Labs/EKG were reviewed/ordered. All questions were answered.   

## 2013-05-26 ENCOUNTER — Telehealth: Payer: Self-pay | Admitting: Internal Medicine

## 2013-05-26 ENCOUNTER — Ambulatory Visit (INDEPENDENT_AMBULATORY_CARE_PROVIDER_SITE_OTHER): Payer: PRIVATE HEALTH INSURANCE | Admitting: Internal Medicine

## 2013-05-26 ENCOUNTER — Encounter: Payer: Self-pay | Admitting: Internal Medicine

## 2013-05-26 VITALS — BP 120/82 | HR 76 | Temp 97.0°F | Resp 16 | Wt 190.0 lb

## 2013-05-26 DIAGNOSIS — R05 Cough: Secondary | ICD-10-CM

## 2013-05-26 DIAGNOSIS — R059 Cough, unspecified: Secondary | ICD-10-CM

## 2013-05-26 DIAGNOSIS — J209 Acute bronchitis, unspecified: Secondary | ICD-10-CM

## 2013-05-26 MED ORDER — PSEUDOEPHEDRINE HCL ER 120 MG PO TB12: 120.0000 mg | ORAL_TABLET | Freq: Two times a day (BID) | ORAL | Status: DC | PRN

## 2013-05-26 MED ORDER — METHYLPREDNISOLONE ACETATE 80 MG/ML IJ SUSP
80.0000 mg | Freq: Once | INTRAMUSCULAR | Status: AC
  Administered 2013-05-26: 80 mg via INTRAMUSCULAR

## 2013-05-26 MED ORDER — AMOXICILLIN-POT CLAVULANATE 875-125 MG PO TABS: 1.0000 | ORAL_TABLET | Freq: Two times a day (BID) | ORAL | Status: DC

## 2013-05-26 MED ORDER — BENZONATATE 200 MG PO CAPS: 200.0000 mg | ORAL_CAPSULE | Freq: Two times a day (BID) | ORAL | Status: DC | PRN

## 2013-05-26 NOTE — Telephone Encounter (Signed)
Rx called to [facility] and pt notified

## 2013-05-26 NOTE — Telephone Encounter (Signed)
[facility] was out of the benzonatate.  [facility] on [address] Has them.  Can they be called in there? Cell phone is [phone]

## 2013-05-26 NOTE — Progress Notes (Signed)
Pre visit review using our clinic review tool, if applicable. No additional management support is needed unless otherwise documented below in the visit note. 

## 2013-05-26 NOTE — Progress Notes (Signed)
   Subjective:    Patient ID: Zachary George, male    DOB: [birth year], 48 y.o.   MRN: [id number]  Cough This is a new problem. The current episode started more than 1 month ago. The problem has been waxing and waning (not better). The cough is productive of purulent sputum. Associated symptoms include chills, ear pain, nasal congestion, rhinorrhea and a sore throat. Pertinent negatives include no fever, hemoptysis, rash or wheezing. The treatment provided mild relief. There is no history of asthma.      Review of Systems  Constitutional: Positive for chills. Negative for fever.  HENT: Positive for ear pain, rhinorrhea and sore throat.   Respiratory: Positive for cough. Negative for hemoptysis and wheezing.   Skin: Negative for rash.       Objective:   Physical Exam  Constitutional: He is oriented to person, place, and time. He appears well-developed. No distress.  NAD  HENT:  Mouth/Throat: Oropharynx is clear and moist.  Eyes: Conjunctivae are normal. Pupils are equal, round, and reactive to light.  Neck: Normal range of motion. No JVD present. No thyromegaly present.  Cardiovascular: Normal rate, regular rhythm, normal heart sounds and intact distal pulses.  Exam reveals no gallop and no friction rub.   No murmur heard. Pulmonary/Chest: Effort normal and breath sounds normal. No respiratory distress. He has no wheezes. He has no rales. He exhibits no tenderness.  Abdominal: Soft. Bowel sounds are normal. He exhibits no distension and no mass. There is no tenderness. There is no rebound and no guarding.  Musculoskeletal: Normal range of motion. He exhibits no edema and no tenderness.  Lymphadenopathy:    He has no cervical adenopathy.  Neurological: He is alert and oriented to person, place, and time. He has normal reflexes. No cranial nerve deficit. He exhibits normal muscle tone. He displays a negative Romberg sign. Coordination and gait normal.  No meningeal signs  Skin: Skin is  warm and dry. No rash noted.  Psychiatric: He has a normal mood and affect. His behavior is normal. Judgment and thought content normal.    Lab Results  Component Value Date   WBC 7.6 05/22/2013   HGB 16.5 05/22/2013   HCT 47.7 05/22/2013   PLT 230.0 05/22/2013   GLUCOSE 95 05/22/2013   CHOL 217* 05/22/2013   TRIG 48.0 05/22/2013   HDL 60.10 05/22/2013   LDLDIRECT 156.0 05/22/2013   LDLCALC 128* 09/01/2011   ALT 43 05/22/2013   AST 24 05/22/2013   NA 139 05/22/2013   K 4.7 05/22/2013   CL 103 05/22/2013   CREATININE 1.0 05/22/2013   BUN 14 05/22/2013   CO2 28 05/22/2013   TSH 1.30 05/22/2013   PSA 1.45 09/01/2011         Assessment & Plan:

## 2013-05-28 NOTE — Assessment & Plan Note (Addendum)
Not better. Depomedrol 80 mg im Start Augmentin Sudafed prn

## 2013-05-28 NOTE — Assessment & Plan Note (Signed)
Prom-cod prn Tessalon prn

## 2013-09-05 ENCOUNTER — Encounter: Payer: Self-pay | Admitting: Internal Medicine

## 2013-09-05 ENCOUNTER — Ambulatory Visit (INDEPENDENT_AMBULATORY_CARE_PROVIDER_SITE_OTHER): Payer: PRIVATE HEALTH INSURANCE | Admitting: Internal Medicine

## 2013-09-05 VITALS — BP 114/80 | HR 62 | Temp 97.9°F | Ht 69.0 in | Wt 197.2 lb

## 2013-09-05 DIAGNOSIS — J019 Acute sinusitis, unspecified: Secondary | ICD-10-CM

## 2013-09-05 DIAGNOSIS — R05 Cough: Secondary | ICD-10-CM

## 2013-09-05 DIAGNOSIS — R059 Cough, unspecified: Secondary | ICD-10-CM

## 2013-09-05 DIAGNOSIS — J309 Allergic rhinitis, unspecified: Secondary | ICD-10-CM

## 2013-09-05 HISTORY — DX: Allergic rhinitis, unspecified: J30.9

## 2013-09-05 MED ORDER — FEXOFENADINE HCL 180 MG PO TABS: 180.0000 mg | ORAL_TABLET | Freq: Every day | ORAL | Status: DC

## 2013-09-05 MED ORDER — AMOXICILLIN-POT CLAVULANATE 875-125 MG PO TABS: 1.0000 | ORAL_TABLET | Freq: Two times a day (BID) | ORAL | Status: DC

## 2013-09-05 MED ORDER — METHYLPREDNISOLONE ACETATE 80 MG/ML IJ SUSP
80.0000 mg | Freq: Once | INTRAMUSCULAR | Status: AC
  Administered 2013-09-05: 80 mg via INTRAMUSCULAR

## 2013-09-05 MED ORDER — PREDNISONE 10 MG PO TABS: ORAL_TABLET | ORAL | Status: DC

## 2013-09-05 NOTE — Assessment & Plan Note (Signed)
With seasonal flare, for depomedrol IM, predpack asd, allegra prn

## 2013-09-05 NOTE — Patient Instructions (Signed)
You had the steroid shot today  Please take all new medication as prescribed - the antibiotic, and prednisone, as well as allegra as needed  Please continue all other medications as before, and refills have been done if requested. Please have the pharmacy call with any other refills you may need.  Please continue your efforts at being more active, low cholesterol diet, and weight control.

## 2013-09-05 NOTE — Assessment & Plan Note (Signed)
Mild to mod, for antibx course,  to f/u any worsening symptoms or concerns 

## 2013-09-05 NOTE — Progress Notes (Signed)
Subjective:    Patient ID: Zachary George, male    DOB: 1965-08-07, 48 y.o.   MRN: [id number]  HPI   Here with 2-3 days acute onset fever, facial pain, pressure, headache, general weakness and malaise, and greenish d/c, with mild ST and cough, but pt denies chest pain, wheezing, increased sob or doe, orthopnea, PND, increased LE swelling, palpitations, dizziness or syncope.  Does have several wks ongoing nasal allergy symptoms with clearish congestion, itch and sneezing, without fever, pain, ST, cough, swelling or wheezing.  Has been outside working and walking more lately, has hx of mild allergies in the past Past Medical History  Diagnosis Date  . Left elbow pain 2011    tennis elbow   No past surgical history on file.  reports that he has never smoked. He does not have any smokeless tobacco history on file. He reports that he does not drink alcohol or use illicit drugs. family history includes Hypertension in his father. No Known Allergies Current Outpatient Prescriptions on File Prior to Visit  Medication Sig Dispense Refill  . Fluticasone Furoate-Vilanterol (BREO ELLIPTA) 100-25 MCG/INH AEPB Inhale 1 Act into the lungs daily.  1 each  5  . HYDROcodone-acetaminophen (NORCO/VICODIN) 5-325 MG per tablet Take 1 tablet by mouth daily. Take 1-2 bid prn pain  100 tablet  0  . hydrocortisone (ANUSOL-HC) 25 MG suppository Place 1 suppository (25 mg total) rectally 2 (two) times daily.  20 suppository  1  . ibuprofen (ADVIL,MOTRIN) 600 MG tablet Take 1 tablet (600 mg total) by mouth every 8 (eight) hours as needed for pain.  30 tablet  0  . latanoprost (XALATAN) 0.005 % ophthalmic solution Place 1 drop into both eyes at bedtime.      . pantoprazole (PROTONIX) 40 MG tablet TAKE ONE TABLET BY MOUTH ONE TIME DAILY  30 tablet  11  . pseudoephedrine (SUDAFED) 120 MG 12 hr tablet Take 1 tablet (120 mg total) by mouth 2 (two) times daily as needed for congestion.  20 tablet  0  . triamcinolone ointment  (KENALOG) 0.5 % Apply topically 2 (two) times daily.  60 g  1   No current facility-administered medications on file prior to visit.   Review of Systems  Constitutional: Negative for unusual diaphoresis or other sweats  HENT: Negative for ringing in ear Eyes: Negative for double vision or worsening visual disturbance.  Respiratory: Negative for choking and stridor.   Gastrointestinal: Negative for vomiting or other signifcant bowel change Genitourinary: Negative for hematuria or decreased urine volume.  Musculoskeletal: Negative for other MSK pain or swelling Skin: Negative for color change and worsening wound.  Neurological: Negative for tremors and numbness other than noted  Psychiatric/Behavioral: Negative for decreased concentration or agitation other than above       Objective:   Physical Exam BP 114/80  Pulse 62  Temp(Src) 97.9 F (36.6 C) (Oral)  Ht 5\' 9"  (1.753 m)  Wt 197 lb 4 oz (89.472 kg)  BMI 29.12 kg/m2  SpO2 96% VS noted, mild ill Constitutional: Pt appears well-developed, well-nourished.  HENT: Head: NCAT.  Right Ear: External ear normal.  Left Ear: External ear normal.  Bilat tm's with mild erythema.  Max sinus areas mild tender.  Pharynx with mild erythema, no exudate Eyes: . Pupils are equal, round, and reactive to light. Conjunctivae and EOM are normal Neck: Normal range of motion. Neck supple.  Cardiovascular: Normal rate and regular rhythm.   Pulmonary/Chest: Effort normal and breath sounds normal.  Neurological: Pt is alert. Not confused , motor grossly intact Skin: Skin is warm. No rash Psychiatric: Pt behavior is normal. No agitation.      Assessment & Plan:

## 2013-09-05 NOTE — Assessment & Plan Note (Signed)
Exam o/w benign, d/w pt, mild, nonprod, likely realted to sinus, ok to hold on further eval such as cxr

## 2013-09-05 NOTE — Progress Notes (Signed)
Pre visit review using our clinic review tool, if applicable. No additional management support is needed unless otherwise documented below in the visit note. 

## 2013-09-21 ENCOUNTER — Telehealth: Payer: Self-pay | Admitting: Internal Medicine

## 2013-09-21 NOTE — Telephone Encounter (Signed)
Patient states that his allergies are still bothering him at night. He coughs all night and wakes up his family. He came in to see Dr. Jenny Reichmann on 09/05/2013 for his allergies. He wants to know if Dr. Alain Marion will prescribe the cough syrup that was given to him by Dr. Anitra Lauth a few months ago without an OV. He says that it worked very well for his cough. Please advise.

## 2013-09-22 MED ORDER — PROMETHAZINE-CODEINE 6.25-10 MG/5ML PO SYRP: 5.0000 mL | ORAL_SOLUTION | ORAL | Status: DC | PRN

## 2013-09-22 NOTE — Telephone Encounter (Signed)
Pt informed

## 2013-09-22 NOTE — Telephone Encounter (Signed)
Ok Prom-cod 300 ml OV if not better Thx

## 2014-08-31 ENCOUNTER — Encounter: Payer: Self-pay | Admitting: Family Medicine

## 2014-08-31 ENCOUNTER — Ambulatory Visit (INDEPENDENT_AMBULATORY_CARE_PROVIDER_SITE_OTHER): Payer: PRIVATE HEALTH INSURANCE | Admitting: Family Medicine

## 2014-08-31 VITALS — BP 117/76 | HR 72 | Temp 98.4°F | Ht 69.0 in | Wt 178.0 lb

## 2014-08-31 DIAGNOSIS — M545 Low back pain, unspecified: Secondary | ICD-10-CM

## 2014-08-31 MED ORDER — METHYLPREDNISOLONE 4 MG PO TBPK: ORAL_TABLET | ORAL | Status: DC

## 2014-08-31 MED ORDER — HYDROCODONE-ACETAMINOPHEN 10-325 MG PO TABS: 1.0000 | ORAL_TABLET | Freq: Four times a day (QID) | ORAL | Status: DC | PRN

## 2014-08-31 MED ORDER — TIZANIDINE HCL 4 MG PO TABS: 4.0000 mg | ORAL_TABLET | Freq: Four times a day (QID) | ORAL | Status: DC | PRN

## 2014-08-31 NOTE — Progress Notes (Signed)
   Subjective:    Patient ID: Zachary George, male    DOB: 1966/02/06, 49 y.o.   MRN: [id number]  HPI Here for one week of sharp pains in the left lower back. He woke up like this one morning. No recent trauma. Using Advil with some relief. No pain in the legs.    Review of Systems  Constitutional: Negative.   Musculoskeletal: Positive for back pain.       Objective:   Physical Exam  Constitutional: He appears well-developed and well-nourished.  Musculoskeletal:  He has some spasm in the lower back but ROM is full. Negative SLR. Tender over the lumbar spine.           Assessment & Plan:  Low back spasms. Use heat and Tizanadine, Norco, and a Medrol dose pack. Recheck prn

## 2014-08-31 NOTE — Progress Notes (Signed)
Pre visit review using our clinic review tool, if applicable. No additional management support is needed unless otherwise documented below in the visit note. 

## 2014-09-04 ENCOUNTER — Telehealth: Payer: Self-pay | Admitting: Internal Medicine

## 2014-09-04 NOTE — Telephone Encounter (Signed)
He can simply cut the 10 mg pills in half. Otherwise follow up with Dr. Alain Marion

## 2014-09-04 NOTE — Telephone Encounter (Signed)
Pt saw dr fry for his issues.  Pt states the HYDROcodone-acetaminophen (NORCO) 10-325 MG per tablet is too strong. Pt would like to switch to HYDROcodone-acetaminophen (NORCO) 5-325 MG per tablet

## 2014-09-04 NOTE — Telephone Encounter (Signed)
I spoke with pt  

## 2014-09-12 ENCOUNTER — Other Ambulatory Visit: Payer: Self-pay | Admitting: Family Medicine

## 2014-10-16 ENCOUNTER — Telehealth: Payer: Self-pay | Admitting: Internal Medicine

## 2014-10-16 NOTE — Telephone Encounter (Signed)
Pt called in and said Dr fry gave him hydrocodone for his back and he is wanting a refill.  He said that the 10s are to strong and he only needs the 5s.  He has been breaking them in 1/2   Best number [phone]

## 2014-10-17 ENCOUNTER — Other Ambulatory Visit: Payer: Self-pay | Admitting: Internal Medicine

## 2014-10-17 MED ORDER — HYDROCODONE-ACETAMINOPHEN 5-325 MG PO TABS: 1.0000 | ORAL_TABLET | Freq: Every day | ORAL | Status: DC

## 2014-10-17 NOTE — Telephone Encounter (Signed)
Ok #60 OV if problems Thx

## 2014-10-17 NOTE — Telephone Encounter (Signed)
Notified pt with md response. Place rx in cabinet for pick-up.../lmb 

## 2015-02-13 ENCOUNTER — Telehealth: Payer: Self-pay | Admitting: *Deleted

## 2015-02-13 ENCOUNTER — Telehealth: Payer: Self-pay | Admitting: Internal Medicine

## 2015-02-13 NOTE — Telephone Encounter (Signed)
Pt called wanting a refill for his HYDROcodone-acetaminophen (NORCO/VICODIN) 5-325 MG per tablet [[id number]]  He is not accepting that he needs an OV. He states he's never had to do that before.  Please advise pt.  He would like to hear back from [place] today.

## 2015-02-13 NOTE — Telephone Encounter (Signed)
Called pt no answer LMOM need OV last saw md 09/2013...Johny Chess

## 2015-02-13 NOTE — Telephone Encounter (Signed)
Pt left msg on triage requesting refill on his hydrocodone...Zachary George

## 2015-02-14 NOTE — Telephone Encounter (Signed)
OK#20 Norco.  Sch OV Please inform the pt that narcotics Rx policies have changed Thx

## 2015-02-14 NOTE — Telephone Encounter (Signed)
Patient has called back.  He is requesting a call back in regards as soon as possible.

## 2015-02-15 ENCOUNTER — Telehealth: Payer: Self-pay | Admitting: Internal Medicine

## 2015-02-15 ENCOUNTER — Encounter: Payer: Self-pay | Admitting: Internal Medicine

## 2015-02-15 MED ORDER — HYDROCODONE-ACETAMINOPHEN 5-325 MG PO TABS: 1.0000 | ORAL_TABLET | Freq: Every day | ORAL | Status: DC

## 2015-02-15 NOTE — Telephone Encounter (Signed)
Patient called back in.  He is requesting a call back in regards.

## 2015-02-15 NOTE — Telephone Encounter (Signed)
Pt informed Rx is ready for p/u. I also informed him per PCP he needs OV and UDS.

## 2015-02-15 NOTE — Telephone Encounter (Signed)
Patient came in to get script. Once notified that UDS being requested, patient became belligerent. I took the patient into a side room to explain the intent of UDS. He became irate, and slammed the door. He submitted the UDS, and requested a copy of controlled substance contract. Patient never signed one. i apologized and advised that he will need to sign one, and the fact that he hasnt is an error.   Dr. Alain Marion,  Patient requests that you call him personally. He was highly upset.

## 2015-02-15 NOTE — Telephone Encounter (Signed)
Disregard

## 2015-03-08 ENCOUNTER — Encounter: Payer: Self-pay | Admitting: Internal Medicine

## 2015-03-13 ENCOUNTER — Encounter: Payer: Self-pay | Admitting: Internal Medicine

## 2015-03-13 ENCOUNTER — Ambulatory Visit (INDEPENDENT_AMBULATORY_CARE_PROVIDER_SITE_OTHER): Payer: PRIVATE HEALTH INSURANCE | Admitting: Internal Medicine

## 2015-03-13 VITALS — BP 134/80 | HR 78 | Ht 69.0 in | Wt 175.0 lb

## 2015-03-13 DIAGNOSIS — F5102 Adjustment insomnia: Secondary | ICD-10-CM

## 2015-03-13 DIAGNOSIS — M25511 Pain in right shoulder: Secondary | ICD-10-CM

## 2015-03-13 DIAGNOSIS — T148XXA Other injury of unspecified body region, initial encounter: Secondary | ICD-10-CM

## 2015-03-13 DIAGNOSIS — T148 Other injury of unspecified body region: Secondary | ICD-10-CM | POA: Diagnosis not present

## 2015-03-13 DIAGNOSIS — Z23 Encounter for immunization: Secondary | ICD-10-CM

## 2015-03-13 DIAGNOSIS — Z0000 Encounter for general adult medical examination without abnormal findings: Secondary | ICD-10-CM

## 2015-03-13 MED ORDER — NORTRIPTYLINE HCL 10 MG PO CAPS: 10.0000 mg | ORAL_CAPSULE | Freq: Every day | ORAL | Status: DC

## 2015-03-13 MED ORDER — IBUPROFEN 600 MG PO TABS: 600.0000 mg | ORAL_TABLET | Freq: Three times a day (TID) | ORAL | Status: DC | PRN

## 2015-03-13 MED ORDER — HYDROCODONE-ACETAMINOPHEN 5-325 MG PO TABS: 1.0000 | ORAL_TABLET | Freq: Every day | ORAL | Status: DC

## 2015-03-13 MED ORDER — PREMIER PROTEIN SHAKE: 2.0000 [oz_av] | Freq: Two times a day (BID) | ORAL | Status: DC

## 2015-03-13 NOTE — Assessment & Plan Note (Signed)
Nortriptyline low dose at HS

## 2015-03-13 NOTE — Assessment & Plan Note (Signed)
Dr Bruna Potter prn  Ibuprofen prn

## 2015-03-13 NOTE — Progress Notes (Signed)
Subjective:  Patient ID: Zachary George, male    DOB: 1965/08/06  Age: 49 y.o. MRN: [id number]  CC: Annual Exam   HPI Zachary George presents for a well exam. C/o R shoulder pain. Pt lost diet and Premier protein shakes 2 a day. C/o insomnia x 12 mo (4 h/night).  Outpatient Prescriptions Prior to Visit  Medication Sig Dispense Refill  . fexofenadine (ALLEGRA) 180 MG tablet Take 1 tablet (180 mg total) by mouth daily. 30 tablet 2  . hydrocortisone (ANUSOL-HC) 25 MG suppository Place 1 suppository (25 mg total) rectally 2 (two) times daily. 20 suppository 1  . latanoprost (XALATAN) 0.005 % ophthalmic solution Place 1 drop into both eyes at bedtime.    . pseudoephedrine (SUDAFED) 120 MG 12 hr tablet Take 1 tablet (120 mg total) by mouth 2 (two) times daily as needed for congestion. 20 tablet 0  . tiZANidine (ZANAFLEX) 4 MG tablet TAKE 1 TABLET BY MOUTH EVERY 6 HOURS AS NEEDED FOR MUSCLE SPASMS. 60 tablet 11  . triamcinolone ointment (KENALOG) 0.5 % Apply topically 2 (two) times daily. 60 g 1  . Fluticasone Furoate-Vilanterol (BREO ELLIPTA) 100-25 MCG/INH AEPB Inhale 1 Act into the lungs daily. 1 each 5  . HYDROcodone-acetaminophen (NORCO/VICODIN) 5-325 MG tablet Take 1 tablet by mouth daily. Take 1-2 bid prn pain 20 tablet 0  . ibuprofen (ADVIL,MOTRIN) 600 MG tablet Take 1 tablet (600 mg total) by mouth every 8 (eight) hours as needed for pain. 30 tablet 0  . methylPREDNISolone (MEDROL DOSEPAK) 4 MG TBPK tablet As directed 21 tablet 0  . promethazine-codeine (PHENERGAN WITH CODEINE) 6.25-10 MG/5ML syrup Take 5 mLs by mouth every 4 (four) hours as needed for cough. 300 mL 0  . pantoprazole (PROTONIX) 40 MG tablet TAKE ONE TABLET BY MOUTH ONE TIME DAILY (Patient not taking: Reported on 03/13/2015) 30 tablet 11  . predniSONE (DELTASONE) 10 MG tablet 3 tabs by mouth per day for 3 days,2tabs per day for 3 days,1tab per day for 3 days (Patient not taking: Reported on 03/13/2015) 18 tablet 0   No  facility-administered medications prior to visit.    ROS Review of Systems  Constitutional: Negative for appetite change, fatigue and unexpected weight change.  HENT: Negative for congestion, nosebleeds, sneezing, sore throat and trouble swallowing.   Eyes: Negative for itching and visual disturbance.  Respiratory: Negative for cough.   Cardiovascular: Negative for chest pain, palpitations and leg swelling.  Gastrointestinal: Negative for nausea, diarrhea, blood in stool and abdominal distention.  Genitourinary: Negative for frequency and hematuria.  Musculoskeletal: Positive for back pain and arthralgias. Negative for joint swelling, gait problem and neck pain.  Skin: Negative for rash.  Neurological: Negative for dizziness, tremors, speech difficulty and weakness.  Psychiatric/Behavioral: Negative for sleep disturbance, dysphoric mood and agitation. The patient is not nervous/anxious.     Objective:  BP 134/80 mmHg  Pulse 78  Ht 5\' 9"  (1.753 m)  Wt 175 lb (79.379 kg)  BMI 25.83 kg/m2  SpO2 98%  BP Readings from Last 3 Encounters:  03/13/15 134/80  08/31/14 117/76  09/05/13 114/80    Wt Readings from Last 3 Encounters:  03/13/15 175 lb (79.379 kg)  08/31/14 178 lb (80.74 kg)  09/05/13 197 lb 4 oz (89.472 kg)    Physical Exam  Constitutional: He is oriented to person, place, and time. He appears well-developed. No distress.  NAD  HENT:  Mouth/Throat: Oropharynx is clear and moist.  Eyes: Conjunctivae are normal. Pupils are equal, round,  and reactive to light.  Neck: Normal range of motion. No JVD present. No thyromegaly present.  Cardiovascular: Normal rate, regular rhythm, normal heart sounds and intact distal pulses.  Exam reveals no gallop and no friction rub.   No murmur heard. Pulmonary/Chest: Effort normal and breath sounds normal. No respiratory distress. He has no wheezes. He has no rales. He exhibits no tenderness.  Abdominal: Soft. Bowel sounds are normal. He  exhibits no distension and no mass. There is no tenderness. There is no rebound and no guarding.  Musculoskeletal: Normal range of motion. He exhibits tenderness. He exhibits no edema.  Lymphadenopathy:    He has no cervical adenopathy.  Neurological: He is alert and oriented to person, place, and time. He has normal reflexes. No cranial nerve deficit. He exhibits normal muscle tone. He displays a negative Romberg sign. Coordination and gait normal.  Skin: Skin is warm and dry. No rash noted.  Psychiatric: He has a normal mood and affect. His behavior is normal. Judgment and thought content normal.  R shoulder hurts w/ROM  Lab Results  Component Value Date   WBC 7.6 05/22/2013   HGB 16.5 05/22/2013   HCT 47.7 05/22/2013   PLT 230.0 05/22/2013   GLUCOSE 95 05/22/2013   CHOL 217* 05/22/2013   TRIG 48.0 05/22/2013   HDL 60.10 05/22/2013   LDLDIRECT 156.0 05/22/2013   LDLCALC 128* 09/01/2011   ALT 43 05/22/2013   AST 24 05/22/2013   NA 139 05/22/2013   K 4.7 05/22/2013   CL 103 05/22/2013   CREATININE 1.0 05/22/2013   BUN 14 05/22/2013   CO2 28 05/22/2013   TSH 1.30 05/22/2013   PSA 1.45 09/01/2011    Dg Chest 2 View  05/01/2013  CLINICAL DATA:  Cough, congestion and shortness of breath. EXAM: CHEST  2 VIEW COMPARISON:  04/24/2009. FINDINGS: Trachea is midline. Heart size normal. Biapical pleural parenchymal scarring. Lungs are otherwise clear. No pleural fluid. IMPRESSION: No acute findings. Electronically Signed   By: Lorin Picket M.D.   On: 05/01/2013 15:52    Assessment & Plan:   Jakorian was seen today for annual exam.  Diagnoses and all orders for this visit:  Well adult exam -     Vitamin B12; Future -     Vit D  25 hydroxy (rtn osteoporosis monitoring); Future -     TSH; Future -     Urinalysis; Future -     PSA; Future -     Basic metabolic panel; Future -     CBC with Differential/Platelet; Future -     Hepatic function panel; Future -     Lipid panel;  Future -     Testosterone; Future  Insomnia due to stress -     Vitamin B12; Future -     Vit D  25 hydroxy (rtn osteoporosis monitoring); Future -     TSH; Future -     Urinalysis; Future -     PSA; Future -     Basic metabolic panel; Future -     CBC with Differential/Platelet; Future -     Hepatic function panel; Future -     Lipid panel; Future -     Testosterone; Future  Muscle strain -     ibuprofen (ADVIL,MOTRIN) 600 MG tablet; Take 1 tablet (600 mg total) by mouth every 8 (eight) hours as needed for moderate pain. -     Vitamin B12; Future -     Vit D  25 hydroxy (rtn osteoporosis monitoring); Future -     TSH; Future -     Urinalysis; Future -     PSA; Future -     Basic metabolic panel; Future -     CBC with Differential/Platelet; Future -     Hepatic function panel; Future -     Lipid panel; Future  Pain in joint of right shoulder -     Vitamin B12; Future -     Vit D  25 hydroxy (rtn osteoporosis monitoring); Future -     TSH; Future -     Urinalysis; Future -     PSA; Future -     Basic metabolic panel; Future -     CBC with Differential/Platelet; Future -     Hepatic function panel; Future -     Lipid panel; Future -     Ambulatory referral to Sports Medicine  Need for influenza vaccination -     Flu Vaccine QUAD 36+ mos IM  Other orders -     protein supplement shake (PREMIER PROTEIN) LIQD; Take 59.1 mLs (2 oz total) by mouth 2 (two) times daily between meals. -     HYDROcodone-acetaminophen (NORCO/VICODIN) 5-325 MG tablet; Take 1 tablet by mouth daily. Take 1-2 bid prn pain -     nortriptyline (PAMELOR) 10 MG capsule; Take 1 capsule (10 mg total) by mouth at bedtime.   I have discontinued Mr. Roesler Fluticasone Furoate-Vilanterol, predniSONE, promethazine-codeine, and methylPREDNISolone. I have also changed his ibuprofen. Additionally, I am having him start on protein supplement shake and nortriptyline. Lastly, I am having him maintain his  hydrocortisone, triamcinolone ointment, latanoprost, pantoprazole, pseudoephedrine, fexofenadine, tiZANidine, dorzolamide, TRAVATAN Z, and HYDROcodone-acetaminophen.  Meds ordered this encounter  Medications  . dorzolamide (TRUSOPT) 2 % ophthalmic solution    Sig: Place 1 drop into both eyes 2 (two) times daily.    Refill:  6  . TRAVATAN Z 0.004 % SOLN ophthalmic solution    Sig: Place 1 drop into both eyes at bedtime.    Refill:  6  . protein supplement shake (PREMIER PROTEIN) LIQD    Sig: Take 59.1 mLs (2 oz total) by mouth 2 (two) times daily between meals.    Dispense:  60 Can    Refill:  11  . HYDROcodone-acetaminophen (NORCO/VICODIN) 5-325 MG tablet    Sig: Take 1 tablet by mouth daily. Take 1-2 bid prn pain    Dispense:  60 tablet    Refill:  0  . ibuprofen (ADVIL,MOTRIN) 600 MG tablet    Sig: Take 1 tablet (600 mg total) by mouth every 8 (eight) hours as needed for moderate pain.    Dispense:  90 tablet    Refill:  2  . nortriptyline (PAMELOR) 10 MG capsule    Sig: Take 1 capsule (10 mg total) by mouth at bedtime.    Dispense:  30 capsule    Refill:  5     Follow-up: Return in about 6 months (around 09/10/2015) for a follow-up visit.  Walker Kehr, MD

## 2015-03-13 NOTE — Assessment & Plan Note (Signed)
We discussed age appropriate health related issues, including available/recomended screening tests and vaccinations. We discussed a need for adhering to healthy diet and exercise. Labs/EKG were reviewed/ordered. All questions were answered.   

## 2015-03-13 NOTE — Progress Notes (Signed)
Pre visit review using our clinic review tool, if applicable. No additional management support is needed unless otherwise documented below in the visit note. 

## 2015-04-09 ENCOUNTER — Ambulatory Visit (INDEPENDENT_AMBULATORY_CARE_PROVIDER_SITE_OTHER): Payer: PRIVATE HEALTH INSURANCE | Admitting: Family Medicine

## 2015-04-09 ENCOUNTER — Other Ambulatory Visit (INDEPENDENT_AMBULATORY_CARE_PROVIDER_SITE_OTHER): Payer: PRIVATE HEALTH INSURANCE

## 2015-04-09 ENCOUNTER — Encounter: Payer: Self-pay | Admitting: Family Medicine

## 2015-04-09 VITALS — BP 126/80 | HR 82 | Wt 178.0 lb

## 2015-04-09 DIAGNOSIS — M25511 Pain in right shoulder: Secondary | ICD-10-CM

## 2015-04-09 DIAGNOSIS — M129 Arthropathy, unspecified: Secondary | ICD-10-CM

## 2015-04-09 DIAGNOSIS — M19019 Primary osteoarthritis, unspecified shoulder: Secondary | ICD-10-CM

## 2015-04-09 NOTE — Patient Instructions (Signed)
Good to see you Ice 20 minutes 2 times daily. Usually after activity and before bed. AC joint arthritis and swelling  pennsaid pinkie amount topically 2 times daily as needed.  For the foot  Vitamin D 4000 IU daily  Good shoes with rigid bottom.  Jalene Mullet, Merrell or New balance greater then 700 Avoid being barefoot Ice bath at night.  See me again in 4 weeks.  Happy holidays!

## 2015-04-09 NOTE — Progress Notes (Signed)
[facility] [place] [place], [place] [address] Phone: [phone] Subjective:    I'm seeing this patient by the request  of:  Walker Kehr, MD  CC: right shoulder pain  [id number] Zachary George is a 49 y.o. male coming in with complaint of right shoulder pain.  Patient does not remember any true injury. Seems to be worsening over the course of several weeks. Patient describes the pain as a dull, throbbing aching pain. Seems to be worse when he reaches across his body. States that reaching up seems to be okay. Sometimes behind his back gives him pain. States that repetitive motion usually the next day he seems to be painful. States the pain can be 8 out of 10.denies any pain on the contralateral sign. States that the pain can wake him up at night. Has tried anti-inflammatories over-the-counter with mild improvement.     Past Medical History  Diagnosis Date  . Left elbow pain 2011    tennis elbow  . Allergic rhinitis, cause unspecified 09/05/2013   History reviewed. No pertinent past surgical history. Social History  Substance Use Topics  . Smoking status: Never Smoker   . Smokeless tobacco: Never Used  . Alcohol Use: 0.0 oz/week    0 Standard drinks or equivalent per week     Comment: rare   No Known Allergies Family History  Problem Relation Age of Onset  . Hypertension Father      Past medical history, social, surgical and family history all reviewed in electronic medical record.   Review of Systems: No headache, visual changes, nausea, vomiting, diarrhea, constipation, dizziness, abdominal pain, skin rash, fevers, chills, night sweats, weight loss, swollen lymph nodes, body aches, joint swelling, muscle aches, chest pain, shortness of breath, mood changes.   Objective Blood pressure 126/80, pulse 82, weight 178 lb (80.74 kg), SpO2 97 %.  General: No apparent distress alert and oriented x3 mood and affect normal, dressed appropriately.    HEENT: Pupils equal, extraocular movements intact  Respiratory: Patient's speak in full sentences and does not appear short of breath  Cardiovascular: No lower extremity edema, non tender, no erythema  Skin: Warm dry intact with no signs of infection or rash on extremities or on axial skeleton.  Abdomen: Soft nontender  Neuro: Cranial nerves II through XII are intact, neurovascularly intact in all extremities with 2+ DTRs and 2+ pulses.  Lymph: No lymphadenopathy of posterior or anterior cervical chain or axillae bilaterally.  Gait normal with good balance and coordination.  MSK:  Non tender with full range of motion and good stability and symmetric strength and tone of shoulders, elbows, wrist, hip, knee and ankles bilaterally.   Shoulder: Right Inspection reveals no abnormalities, atrophy or asymmetry. Really tender over the acromioclavicularjoint ROM is full in all planes passively. Rotator cuff strength normal throughout. signs of impingement with positive Neer and Hawkin's tests, but negative empty can sign. Speeds and Yergason's tests normal. Positive crossover test. Normal scapular function observed. No painful arc and no drop arm sign. No apprehension sign  MSK US performed of: Right This study was ordered, performed, and interpreted by Charlann Boxer D.O.  Shoulder:   Supraspinatus:  Appears normal on long and transverse views, mild bursitis noted Infraspinatus:  Appears normal on long and transverse views. Significant increase in Doppler flow Subscapularis:  Appears normal on long and transverse views. Positive bursa Teres Minor:  Appears normal on long and transverse views. AC joint:  Capsule undistended, no  geyser sign. Glenohumeral Joint:  Appears normal without effusion. Glenoid Labrum:  Intact without visualized tears. Biceps Tendon:  Appears normal on long and transverse views, no fraying of tendon, tendon located in intertubercular groove, no subluxation with shoulder  internal or external rotation.  Impression: coming clavicular arthritis  Procedure: Real-time Ultrasound Guided Injection of right coming clavicular joint Device: GE Logiq E  Ultrasound guided injection is preferred based studies that show increased duration, increased effect, greater accuracy, decreased procedural pain, increased response rate with ultrasound guided versus blind injection.  Verbal informed consent obtained.  Time-out conducted.  Noted no overlying erythema, induration, or other signs of local infection.  Skin prepped in a sterile fashion.  Local anesthesia: Topical Ethyl chloride.  With sterile technique and under real time ultrasound guidance:  Joint visualized.  25g 1 inch needle inserted superior approachPictures taken for needle placement. Patient did have injection of 0.5  cc of 0.5% Marcaine, and 0.5 cc of Kenalog 40 mg/dL. Completed without difficulty  Pain immediately resolved suggesting accurate placement of the medication.  Advised to call if fevers/chills, erythema, induration, drainage, or persistent bleeding.  Images permanently stored and available for review in the ultrasound unit.  Impression: Technically successful ultrasound guided injection.  Procedure note [id number]; 15 minutes spent for Therapeutic exercises as stated in above notes.  This included exercises focusing on stretching, strengthening, with significant focus on eccentric aspects.Basic scapular stabilization to include adduction and depression of scapula Scaption, focusing on proper movement and good control Internal and External rotation utilizing a theraband, with elbow tucked at side entire time Rows with theraband   Proper technique shown and discussed handout in great detail with ATC.  All questions were discussed and answered.      Impression and Recommendations:     This [place] required medical decision making of moderate complexity.

## 2015-04-09 NOTE — Assessment & Plan Note (Signed)
Patient was given an injection today and tolerated the procedure well. Patient given HEP, icingin, topical NSAIDs and discussed vitamins.  Discussed this may increase in pain for sometime.  Patient will come iback in 3-4 weeks otherwise.  Differential includes rotator cuff tendinopathy but nothing was seen on ultrasound today.

## 2015-05-07 ENCOUNTER — Ambulatory Visit (INDEPENDENT_AMBULATORY_CARE_PROVIDER_SITE_OTHER): Payer: BLUE CROSS/BLUE SHIELD | Admitting: Family Medicine

## 2015-05-07 ENCOUNTER — Other Ambulatory Visit (INDEPENDENT_AMBULATORY_CARE_PROVIDER_SITE_OTHER): Payer: BLUE CROSS/BLUE SHIELD

## 2015-05-07 ENCOUNTER — Encounter: Payer: Self-pay | Admitting: Family Medicine

## 2015-05-07 VITALS — BP 120/74 | HR 79 | Ht 69.0 in | Wt 181.0 lb

## 2015-05-07 DIAGNOSIS — M75101 Unspecified rotator cuff tear or rupture of right shoulder, not specified as traumatic: Secondary | ICD-10-CM | POA: Diagnosis not present

## 2015-05-07 DIAGNOSIS — M25511 Pain in right shoulder: Secondary | ICD-10-CM | POA: Diagnosis not present

## 2015-05-07 DIAGNOSIS — M79672 Pain in left foot: Secondary | ICD-10-CM | POA: Diagnosis not present

## 2015-05-07 DIAGNOSIS — M19019 Primary osteoarthritis, unspecified shoulder: Secondary | ICD-10-CM

## 2015-05-07 DIAGNOSIS — M129 Arthropathy, unspecified: Secondary | ICD-10-CM | POA: Diagnosis not present

## 2015-05-07 NOTE — Progress Notes (Signed)
Pre visit review using our clinic review tool, if applicable. No additional management support is needed unless otherwise documented below in the visit note. 

## 2015-05-07 NOTE — Patient Instructions (Addendum)
Good to see you Ice is your friend Iron 65mg  with 500mg  of vitamin C Uloric daily for 1 week If toe is not better see me again in 3-4 weeks and can try injection.

## 2015-05-07 NOTE — Assessment & Plan Note (Signed)
Attempted an intra-articular injection today. Patient did do very well. Had almost complete resolution of pain. Hopefully this was also conjoining to the acromioclavicular arthritis the patient also had an. Seems to be doing better overall. We discussed icing regimen.patient will then come back and see me again in 3-4 weeks for further evaluation and treatment.

## 2015-05-07 NOTE — Assessment & Plan Note (Signed)
Multifactorial. Patient has had this pain for some time. Mild hallux rigidus noted. Patient does have breakdown of the transverse arch. We discussed the possibility of different treatment and differential does include gout. Patient will do a trial of a gout medication if no improvement he'll come back and we'll inject first toe.

## 2015-05-07 NOTE — Progress Notes (Signed)
[name] [address] [place], [place] [address] Phone: [phone] Subjective:     CC: right shoulder pain f/u  [id number] Erickson Eustache is a 50 y.o. male coming in with complaint of right shoulder pain. Patient was found to have more of an acromioclavicular arthritis. Patient was given injections 4 weeks ago. Patient was to do home exercises and topical anti-inflammatory. Patient states approximately 80% better. Still having pain with certain range of motion. Describes pain as a no, throbbing aching sensation. Patient denies any radiation down the arm. Denies any associated neck pain. States that it seems to be more internal.patient states it still gives him some discomfort at night.  Patient is also complaining of left toe pain. Patient states that it is the first toe. Describes it as a dull, throbbing aching sensation that seems to be intermittent. Sometimes hurts with walking but sometimes can hurt without walking. States sometimes it can be some swelling and redness. Denies any numbness. Has not notice any association with any type of shoes and had tried changing shoes with minimal difference. Does not happen every day and does respond somewhat to anti-inflammatory's.    Past Medical History  Diagnosis Date  . Left elbow pain 2011    tennis elbow  . Allergic rhinitis, cause unspecified 09/05/2013   No past surgical history on file. Social History  Substance Use Topics  . Smoking status: Never Smoker   . Smokeless tobacco: Never Used  . Alcohol Use: 0.0 oz/week    0 Standard drinks or equivalent per week     Comment: rare   No Known Allergies Family History  Problem Relation Age of Onset  . Hypertension Father      Past medical history, social, surgical and family history all reviewed in electronic medical record.   Review of Systems: No headache, visual changes, nausea, vomiting, diarrhea, constipation, dizziness, abdominal pain, skin  rash, fevers, chills, night sweats, weight loss, swollen lymph nodes, body aches, joint swelling, muscle aches, chest pain, shortness of breath, mood changes.   Objective Blood pressure 120/74, pulse 79, height 5\' 9"  (1.753 m), weight 181 lb (82.101 kg), SpO2 97 %.  General: No apparent distress alert and oriented x3 mood and affect normal, dressed appropriately.  HEENT: Pupils equal, extraocular movements intact  Respiratory: Patient's speak in full sentences and does not appear short of breath  Cardiovascular: No lower extremity edema, non tender, no erythema  Skin: Warm dry intact with no signs of infection or rash on extremities or on axial skeleton.  Abdomen: Soft nontender  Neuro: Cranial nerves II through XII are intact, neurovascularly intact in all extremities with 2+ DTRs and 2+ pulses.  Lymph: No lymphadenopathy of posterior or anterior cervical chain or axillae bilaterally.  Gait normal with good balance and coordination.  MSK:  Non tender with full range of motion and good stability and symmetric strength and tone of shoulders, elbows, wrist, hip, knee and ankles bilaterally.  Foot exam shows the patient does have a bunion on the first toe. Mild hammering of the fourth and fifth toes on the right foot. Mild splaying between the first and second toes.  Shoulder: Right Inspection reveals no abnormalities, atrophy or asymmetry. Really tender over the acromioclavicularjoint ROM is full in all planes passively. Rotator cuff strength normal throughout. signs of impingement with positive Neer and Hawkin's tests, but negative empty can sign. Speeds and Yergason's tests normal. Positive crossover test. Normal scapular function observed. No painful arc  and no drop arm sign. No apprehension sign  Procedure: Real-time Ultrasound Guided Injection of right glenohumeral joint Device: GE Logiq E  Ultrasound guided injection is preferred based studies that show increased duration, increased  effect, greater accuracy, decreased procedural pain, increased response rate with ultrasound guided versus blind injection.  Verbal informed consent obtained.  Time-out conducted.  Noted no overlying erythema, induration, or other signs of local infection.  Skin prepped in a sterile fashion.  Local anesthesia: Topical Ethyl chloride.  With sterile technique and under real time ultrasound guidance:  Joint visualized.  23g 1  inch needle inserted posterior approach. Pictures taken for needle placement. Patient did have injection of 2 cc of 1% lidocaine, 2 cc of 0.5% Marcaine, and 1.0 cc of Kenalog 40 mg/dL. Completed without difficulty  Pain immediately resolved suggesting accurate placement of the medication.  Advised to call if fevers/chills, erythema, induration, drainage, or persistent bleeding.  Images permanently stored and available for review in the ultrasound unit.  Impression: Technically successful ultrasound guided injection.     Impression and Recommendations:     This [place] required medical decision making of moderate complexity.

## 2015-05-07 NOTE — Assessment & Plan Note (Signed)
Seems improved after the injection.we'll continue to monitor. Encourage patient to do the exercises on a more regular basis.

## 2015-06-06 ENCOUNTER — Ambulatory Visit: Payer: PRIVATE HEALTH INSURANCE | Admitting: Family Medicine

## 2015-06-14 ENCOUNTER — Ambulatory Visit (INDEPENDENT_AMBULATORY_CARE_PROVIDER_SITE_OTHER): Payer: BLUE CROSS/BLUE SHIELD | Admitting: Family Medicine

## 2015-06-14 ENCOUNTER — Telehealth: Payer: Self-pay | Admitting: Internal Medicine

## 2015-06-14 ENCOUNTER — Encounter: Payer: Self-pay | Admitting: Family Medicine

## 2015-06-14 VITALS — BP 124/84 | HR 56 | Ht 69.0 in | Wt 181.0 lb

## 2015-06-14 DIAGNOSIS — M19019 Primary osteoarthritis, unspecified shoulder: Secondary | ICD-10-CM

## 2015-06-14 DIAGNOSIS — M129 Arthropathy, unspecified: Secondary | ICD-10-CM

## 2015-06-14 DIAGNOSIS — M79672 Pain in left foot: Secondary | ICD-10-CM | POA: Diagnosis not present

## 2015-06-14 DIAGNOSIS — M75101 Unspecified rotator cuff tear or rupture of right shoulder, not specified as traumatic: Secondary | ICD-10-CM | POA: Diagnosis not present

## 2015-06-14 MED ORDER — HYDROCODONE-ACETAMINOPHEN 5-325 MG PO TABS: 1.0000 | ORAL_TABLET | Freq: Every day | ORAL | Status: DC

## 2015-06-14 MED ORDER — ALLOPURINOL 100 MG PO TABS: 100.0000 mg | ORAL_TABLET | Freq: Every day | ORAL | Status: DC

## 2015-06-14 NOTE — Telephone Encounter (Signed)
OK Hydrocodone Thx 

## 2015-06-14 NOTE — Patient Instructions (Signed)
Good to see you  Ice to the toe Allopurinol 100mg  daily for next 30 days to see if it helps the toe Keep trucking along See em when you need me.

## 2015-06-14 NOTE — Progress Notes (Signed)
Pre visit review using our clinic review tool, if applicable. No additional management support is needed unless otherwise documented below in the visit note. 

## 2015-06-14 NOTE — Assessment & Plan Note (Signed)
Significant improvement after the injection. Encourage him to continue home exercises. Can repeat every 3-4 months if needed. Follow-up on an as-needed basis.

## 2015-06-14 NOTE — Assessment & Plan Note (Signed)
Can use to respond well at this time. No significant changes.

## 2015-06-14 NOTE — Telephone Encounter (Signed)
Please advise on hydrocodone refill request, thanks

## 2015-06-14 NOTE — Telephone Encounter (Signed)
Patient needs nortriptyline HCI taken off med list.  Patient no longer takes.  States didn't work for sleep.  Patient also states that premier protein shake is 11oz to be changed on med list.   Patient is also requesting refill on hydrocodone.  Patient is also changing his pharmacy to [facility] on [address].

## 2015-06-14 NOTE — Progress Notes (Signed)
  [name] [address], [place] [address] Phone: [phone] Subjective:    CC: right shoulder pain f/u  [id number] Zachary George is a 50 y.o. male coming in with complaint of right shoulder pain. Found to have a subacromial bursitis as well as a acromial clavicular arthritis. Patient is given an injection in both joints. Patient is 85% better. States that he can do all daily activities. Only when he does a lot of lifting or repetitive activities he has difficulty.  Patient is also complaining of left toe pain. There is concerned the patient may have some gouty deposits in the toe. Patient was given a trial of the medication and states that it did make up 50% improvement and less swelling. Still has some mild discomfort but overall has been able to do more activities. No new symptoms.    Past Medical History  Diagnosis Date  . Left elbow pain 2011    tennis elbow  . Allergic rhinitis, cause unspecified 09/05/2013   No past surgical history on file. Social History  Substance Use Topics  . Smoking status: Never Smoker   . Smokeless tobacco: Never Used  . Alcohol Use: 0.0 oz/week    0 Standard drinks or equivalent per week     Comment: rare   No Known Allergies Family History  Problem Relation Age of Onset  . Hypertension Father      Past medical history, social, surgical and family history all reviewed in electronic medical record.   Review of Systems: No headache, visual changes, nausea, vomiting, diarrhea, constipation, dizziness, abdominal pain, skin rash, fevers, chills, night sweats, weight loss, swollen lymph nodes, body aches, joint swelling, muscle aches, chest pain, shortness of breath, mood changes.   Objective Blood pressure 124/84, pulse 56, height 5\' 9"  (1.753 m), weight 181 lb (82.101 kg), SpO2 98 %.  General: No apparent distress alert and oriented x3 mood and affect normal, dressed appropriately.  HEENT: Pupils equal,  extraocular movements intact  Respiratory: Patient's speak in full sentences and does not appear short of breath  Cardiovascular: No lower extremity edema, non tender, no erythema  Skin: Warm dry intact with no signs of infection or rash on extremities or on axial skeleton.  Abdomen: Soft nontender  Neuro: Cranial nerves II through XII are intact, neurovascularly intact in all extremities with 2+ DTRs and 2+ pulses.  Lymph: No lymphadenopathy of posterior or anterior cervical chain or axillae bilaterally.  Gait normal with good balance and coordination.  MSK:  Non tender with full range of motion and good stability and symmetric strength and tone of shoulders, elbows, wrist, hip, knee and ankles bilaterally.  Foot exam shows the patient does have a bunion on the first toe. Mild hammering of the fourth and fifth toes on the right foot. Mild splaying between the first and second toes. Nontender on exam today.  Shoulder: Right Inspection reveals no abnormalities, atrophy or asymmetry. Really tender over the acromioclavicularjoint ROM is full in all planes passively. Rotator cuff strength normal throughout. Negative pivot signs Speeds and Yergason's tests normal. Positive crossover test still present Normal scapular function observed. No painful arc and no drop arm sign. No apprehension sign Contralateral shoulder unremarkable.      Impression and Recommendations:     This [place] required medical decision making of moderate complexity.

## 2015-06-14 NOTE — Assessment & Plan Note (Signed)
Patient does have more of a first metatarsal mild arthritis. Patient encouraged to wear good shoes. Patient will do more of an icing protocol as well. Topical anti-inflammatories have been given. Given a prescription in for a low dose of allopurinol. Warned of potential side effects. Patient will come back if no significant improvement or worsening symptoms.

## 2015-06-17 NOTE — Telephone Encounter (Signed)
Notifed pt rx ready for pick-up.../lmb 

## 2015-06-29 ENCOUNTER — Encounter: Payer: Self-pay | Admitting: Family Medicine

## 2015-06-29 ENCOUNTER — Ambulatory Visit (INDEPENDENT_AMBULATORY_CARE_PROVIDER_SITE_OTHER): Payer: BLUE CROSS/BLUE SHIELD | Admitting: Family Medicine

## 2015-06-29 VITALS — BP 150/90 | HR 64 | Temp 98.3°F | Resp 20 | Ht 69.0 in | Wt 177.5 lb

## 2015-06-29 DIAGNOSIS — J019 Acute sinusitis, unspecified: Secondary | ICD-10-CM

## 2015-06-29 MED ORDER — FLUTICASONE PROPIONATE 50 MCG/ACT NA SUSP: 2.0000 | Freq: Every day | NASAL | Status: DC

## 2015-06-29 MED ORDER — AMOXICILLIN-POT CLAVULANATE 875-125 MG PO TABS: 1.0000 | ORAL_TABLET | Freq: Two times a day (BID) | ORAL | Status: DC

## 2015-06-29 NOTE — Progress Notes (Signed)
Pre visit review using our clinic review tool, if applicable. No additional management support is needed unless otherwise documented below in the visit note. 

## 2015-06-29 NOTE — Assessment & Plan Note (Addendum)
Encouraged increased rest and hydration, add probiotics, zinc such as Coldeze or Xicam. Treat fevers as needed. Start Augmentin, Mucinex and probiotics, return if symptoms worsen or do not improve.

## 2015-06-29 NOTE — Patient Instructions (Signed)
Plain Mucinex twice daily and a probiotic daily, [employer]age or [place] daily  Sinusitis, Adult Sinusitis is redness, soreness, and inflammation of the paranasal sinuses. Paranasal sinuses are air pockets within the bones of your face. They are located beneath your eyes, in the middle of your forehead, and above your eyes. In healthy paranasal sinuses, mucus is able to drain out, and air is able to circulate through them by way of your nose. However, when your paranasal sinuses are inflamed, mucus and air can become trapped. This can allow bacteria and other germs to grow and cause infection. Sinusitis can develop quickly and last only a short time (acute) or continue over a long period (chronic). Sinusitis that lasts for more than 12 weeks is considered chronic. CAUSES Causes of sinusitis include:  Allergies.  Structural abnormalities, such as displacement of the cartilage that separates your nostrils (deviated septum), which can decrease the air flow through your nose and sinuses and affect sinus drainage.  Functional abnormalities, such as when the small hairs (cilia) that line your sinuses and help remove mucus do not work properly or are not present. SIGNS AND SYMPTOMS Symptoms of acute and chronic sinusitis are the same. The primary symptoms are pain and pressure around the affected sinuses. Other symptoms include:  Upper toothache.  Earache.  Headache.  Bad breath.  Decreased sense of smell and taste.  A cough, which worsens when you are lying flat.  Fatigue.  Fever.  Thick drainage from your nose, which often is green and may contain pus (purulent).  Swelling and warmth over the affected sinuses. DIAGNOSIS Your health care provider will perform a physical exam. During your exam, your health care provider may perform any of the following to help determine if you have acute sinusitis or chronic sinusitis:  Look in your nose for signs of abnormal  growths in your nostrils (nasal polyps).  Tap over the affected sinus to check for signs of infection.  View the inside of your sinuses using an imaging device that has a light attached (endoscope). If your health care provider suspects that you have chronic sinusitis, one or more of the following tests may be recommended:  Allergy tests.  Nasal culture. A sample of mucus is taken from your nose, sent to a lab, and screened for bacteria.  Nasal cytology. A sample of mucus is taken from your nose and examined by your health care provider to determine if your sinusitis is related to an allergy. TREATMENT Most cases of acute sinusitis are related to a viral infection and will resolve on their own within 10 days. Sometimes, medicines are prescribed to help relieve symptoms of both acute and chronic sinusitis. These may include pain medicines, decongestants, nasal steroid sprays, or saline sprays. However, for sinusitis related to a bacterial infection, your health care provider will prescribe antibiotic medicines. These are medicines that will help kill the bacteria causing the infection. Rarely, sinusitis is caused by a fungal infection. In these cases, your health care provider will prescribe antifungal medicine. For some cases of chronic sinusitis, surgery is needed. Generally, these are cases in which sinusitis recurs more than 3 times per year, despite other treatments. HOME CARE INSTRUCTIONS  Drink plenty of water. Water helps thin the mucus so your sinuses can drain more easily.  Use a humidifier.  Inhale steam 3-4 times a day (for example, sit in the bathroom with the shower running).  Apply a warm, moist washcloth to your face 3-4 times a  day, or as directed by your health care provider.  Use saline nasal sprays to help moisten and clean your sinuses.  Take medicines only as directed by your health care provider.  If you were prescribed either an antibiotic or antifungal medicine,  finish it all even if you start to feel better. SEEK IMMEDIATE MEDICAL CARE IF:  You have increasing pain or severe headaches.  You have nausea, vomiting, or drowsiness.  You have swelling around your face.  You have vision problems.  You have a stiff neck.  You have difficulty breathing.   This information is not intended to replace advice given to you by your health care provider. Make sure you discuss any questions you have with your health care provider.   Document Released: 04/20/2005 Document Revised: 05/11/2014 Document Reviewed: 05/05/2011 Elsevier Interactive Patient Education [employer].

## 2015-06-30 NOTE — Progress Notes (Signed)
Patient ID: Zachary George, male   DOB: 12/12/1965, 50 y.o.   MRN: [id number]   Subjective:    Patient ID: Zachary George, male    DOB: December 05, 1965, 50 y.o.   MRN: [id number]  Chief Complaint  Patient presents with  . facial pressure  . c/o dull ache at temperol and jaw area   HPI Patient is in today for evaluation of chest congestion. He has been struggling with head and chest congestion cough, ear pain, facial pain, scratchy throat. Was treated for sinus infection earlier in the month with a cough syrup. Patient denies fevers and chills. No GI or GU complaints.  Past Medical History  Diagnosis Date  . Left elbow pain 2011    tennis elbow  . Allergic rhinitis, cause unspecified 09/05/2013    History reviewed. No pertinent past surgical history.  Family History  Problem Relation Age of Onset  . Hypertension Father     Social History   Social History  . Marital Status: Married    Spouse Name: N/A  . Number of Children: N/A  . Years of Education: N/A   Occupational History  . Not on file.   Social History Main Topics  . Smoking status: Never Smoker   . Smokeless tobacco: Never Used  . Alcohol Use: 0.0 oz/week    0 Standard drinks or equivalent per week     Comment: rare  . Drug Use: No  . Sexual Activity: Yes   Other Topics Concern  . Not on file   Social History Narrative    Outpatient Prescriptions Prior to Visit  Medication Sig Dispense Refill  . allopurinol (ZYLOPRIM) 100 MG tablet Take 1 tablet (100 mg total) by mouth daily. 30 tablet 6  . HYDROcodone-acetaminophen (NORCO/VICODIN) 5-325 MG tablet Take 1 tablet by mouth daily. Take 1-2 bid prn pain 60 tablet 0  . ibuprofen (ADVIL,MOTRIN) 600 MG tablet Take 1 tablet (600 mg total) by mouth every 8 (eight) hours as needed for moderate pain. 90 tablet 2  . pseudoephedrine (SUDAFED) 120 MG 12 hr tablet Take 1 tablet (120 mg total) by mouth 2 (two) times daily as needed for congestion. 20 tablet 0  . tiZANidine  (ZANAFLEX) 4 MG tablet TAKE 1 TABLET BY MOUTH EVERY 6 HOURS AS NEEDED FOR MUSCLE SPASMS. 60 tablet 11  . TRAVATAN Z 0.004 % SOLN ophthalmic solution Place 1 drop into both eyes at bedtime.  6  . triamcinolone ointment (KENALOG) 0.5 % Apply topically 2 (two) times daily. 60 g 1  . fexofenadine (ALLEGRA) 180 MG tablet Take 1 tablet (180 mg total) by mouth daily. 30 tablet 2  . hydrocortisone (ANUSOL-HC) 25 MG suppository Place 1 suppository (25 mg total) rectally 2 (two) times daily. 20 suppository 1  . latanoprost (XALATAN) 0.005 % ophthalmic solution Place 1 drop into both eyes at bedtime.     No facility-administered medications prior to visit.    No Known Allergies  Review of Systems  Constitutional: Negative for fever, chills and malaise/fatigue.  HENT: Positive for congestion and ear pain.   Eyes: Negative for discharge.  Respiratory: Positive for cough, sputum production and shortness of breath.   Cardiovascular: Negative for chest pain, palpitations and leg swelling.  Gastrointestinal: Negative for nausea and abdominal pain.  Genitourinary: Negative for dysuria.  Musculoskeletal: Negative for falls.  Skin: Negative for rash.  Neurological: Positive for headaches. Negative for loss of consciousness.  Endo/Heme/Allergies: Negative for environmental allergies.  Psychiatric/Behavioral: Negative for depression. The patient is not  nervous/anxious.        Objective:    Physical Exam  Constitutional: He is oriented to person, place, and time. He appears well-developed and well-nourished. No distress.  HENT:  Head: Normocephalic and atraumatic.  Nose: Nose normal.  TMs dull and retracted.   Eyes: Right eye exhibits no discharge. Left eye exhibits no discharge.  Neck: Normal range of motion. Neck supple.  Cardiovascular: Normal rate and regular rhythm.   No murmur heard. Pulmonary/Chest: Effort normal and breath sounds normal.  Abdominal: Soft. Bowel sounds are normal. There is  no tenderness.  Musculoskeletal: He exhibits no edema.  Neurological: He is alert and oriented to person, place, and time.  Skin: Skin is warm and dry.  Psychiatric: He has a normal mood and affect.  Nursing note and vitals reviewed.   BP 150/90 mmHg  Pulse 64  Temp(Src) 98.3 F (36.8 C) (Oral)  Resp 20  Ht 5\' 9"  (1.753 m)  Wt 177 lb 8 oz (80.513 kg)  BMI 26.20 kg/m2  SpO2 99% Wt Readings from Last 3 Encounters:  06/29/15 177 lb 8 oz (80.513 kg)  06/14/15 181 lb (82.101 kg)  05/07/15 181 lb (82.101 kg)     Lab Results  Component Value Date   WBC 7.6 05/22/2013   HGB 16.5 05/22/2013   HCT 47.7 05/22/2013   PLT 230.0 05/22/2013   GLUCOSE 95 05/22/2013   CHOL 217* 05/22/2013   TRIG 48.0 05/22/2013   HDL 60.10 05/22/2013   LDLDIRECT 156.0 05/22/2013   LDLCALC 128* 09/01/2011   ALT 43 05/22/2013   AST 24 05/22/2013   NA 139 05/22/2013   K 4.7 05/22/2013   CL 103 05/22/2013   CREATININE 1.0 05/22/2013   BUN 14 05/22/2013   CO2 28 05/22/2013   TSH 1.30 05/22/2013   PSA 1.45 09/01/2011    Lab Results  Component Value Date   TSH 1.30 05/22/2013   Lab Results  Component Value Date   WBC 7.6 05/22/2013   HGB 16.5 05/22/2013   HCT 47.7 05/22/2013   MCV 85.3 05/22/2013   PLT 230.0 05/22/2013   Lab Results  Component Value Date   NA 139 05/22/2013   K 4.7 05/22/2013   CO2 28 05/22/2013   GLUCOSE 95 05/22/2013   BUN 14 05/22/2013   CREATININE 1.0 05/22/2013   BILITOT 0.9 05/22/2013   ALKPHOS 69 05/22/2013   AST 24 05/22/2013   ALT 43 05/22/2013   PROT 7.8 05/22/2013   ALBUMIN 4.5 05/22/2013   CALCIUM 9.7 05/22/2013   GFR 84.59 05/22/2013   Lab Results  Component Value Date   CHOL 217* 05/22/2013   Lab Results  Component Value Date   HDL 60.10 05/22/2013   Lab Results  Component Value Date   LDLCALC 128* 09/01/2011   Lab Results  Component Value Date   TRIG 48.0 05/22/2013   Lab Results  Component Value Date   CHOLHDL 4 05/22/2013   No  results found for: HGBA1C     Assessment & Plan:   Problem List Items Addressed This Visit    SINUSITIS, ACUTE - Primary    Encouraged increased rest and hydration, add probiotics, zinc such as Coldeze or Xicam. Treat fevers as needed. Start Augmentin, Mucinex and probiotics, return if symptoms worsen or do not improve.      Relevant Medications   amoxicillin-clavulanate (AUGMENTIN) 875-125 MG tablet   fluticasone (FLONASE) 50 MCG/ACT nasal spray      I have discontinued Mr. Shalom hydrocortisone,  latanoprost, and fexofenadine. I am also having him start on amoxicillin-clavulanate and fluticasone. Additionally, I am having him maintain his triamcinolone ointment, pseudoephedrine, tiZANidine, TRAVATAN Z, ibuprofen, allopurinol, HYDROcodone-acetaminophen, and nortriptyline.  Meds ordered this encounter  Medications  . nortriptyline (PAMELOR) 10 MG capsule    Sig: Take 10 mg by mouth at bedtime.    Refill:  4  . amoxicillin-clavulanate (AUGMENTIN) 875-125 MG tablet    Sig: Take 1 tablet by mouth 2 (two) times daily.    Dispense:  28 tablet    Refill:  0  . fluticasone (FLONASE) 50 MCG/ACT nasal spray    Sig: Place 2 sprays into both nostrils daily.    Dispense:  16 g    Refill:  0     Penni Homans, MD

## 2015-07-22 ENCOUNTER — Other Ambulatory Visit (INDEPENDENT_AMBULATORY_CARE_PROVIDER_SITE_OTHER): Payer: BLUE CROSS/BLUE SHIELD

## 2015-07-22 ENCOUNTER — Ambulatory Visit (INDEPENDENT_AMBULATORY_CARE_PROVIDER_SITE_OTHER): Payer: BLUE CROSS/BLUE SHIELD | Admitting: Family Medicine

## 2015-07-22 ENCOUNTER — Ambulatory Visit (INDEPENDENT_AMBULATORY_CARE_PROVIDER_SITE_OTHER)
Admission: RE | Admit: 2015-07-22 | Discharge: 2015-07-22 | Disposition: A | Discharge status: Home / Self Care | Payer: BLUE CROSS/BLUE SHIELD | Source: Ambulatory Visit | Attending: Family Medicine | Admitting: Family Medicine

## 2015-07-22 ENCOUNTER — Encounter: Payer: Self-pay | Admitting: Family Medicine

## 2015-07-22 ENCOUNTER — Encounter: Payer: Self-pay | Admitting: *Deleted

## 2015-07-22 VITALS — BP 136/82 | HR 72 | Wt 179.0 lb

## 2015-07-22 DIAGNOSIS — M25511 Pain in right shoulder: Secondary | ICD-10-CM

## 2015-07-22 DIAGNOSIS — M129 Arthropathy, unspecified: Secondary | ICD-10-CM

## 2015-07-22 DIAGNOSIS — M19019 Primary osteoarthritis, unspecified shoulder: Secondary | ICD-10-CM

## 2015-07-22 DIAGNOSIS — M79672 Pain in left foot: Secondary | ICD-10-CM | POA: Diagnosis not present

## 2015-07-22 MED ORDER — ALLOPURINOL 300 MG PO TABS: 300.0000 mg | ORAL_TABLET | Freq: Every day | ORAL | Status: DC

## 2015-07-22 MED ORDER — INDOMETHACIN ER 75 MG PO CPCR: 75.0000 mg | ORAL_CAPSULE | Freq: Two times a day (BID) | ORAL | Status: DC

## 2015-07-22 NOTE — Progress Notes (Signed)
[name] [address], [place] [address] Phone: [phone] Subjective:    CC: right shoulder pain f/u  [id number] Zachary George is a 50 y.o. male coming in with complaint of right shoulder pain. Found to have a subacromial bursitis as well as a acromial clavicular arthritis. Patient was doing much better. Starting to have recurrent symptoms. States that it seems to be worse when he reaches across his body. Describes it as a dull, throbbing aching sensation. Patient states that it is affecting his job. Addition of this isn't certain activities such as mowing the lawn has become very difficult. States that even sleeping on the side can cause severe pain. Patient would consider an MRI but does not have surgical intervention as well as is concerned about the financial responsibilities of the MRI.  Patient is also complaining of left toe pain. There is concerned the patient may have some gouty deposits in the toe. Patient was doing somewhat better at follow-up.patient started on allopurinol but states that the 100 mg seems to have made it worse. He did respond well to uloric previously. We discussed icing regimen which she has not been doing. States that certain shoes do seem to make it worse. Patient does not remember any new injuries.    Past Medical History  Diagnosis Date  . Left elbow pain 2011    tennis elbow  . Allergic rhinitis, cause unspecified 09/05/2013   No past surgical history on file. Social History  Substance Use Topics  . Smoking status: Never Smoker   . Smokeless tobacco: Never Used  . Alcohol Use: 0.0 oz/week    0 Standard drinks or equivalent per week     Comment: rare   No Known Allergies Family History  Problem Relation Age of Onset  . Hypertension Father      Past medical history, social, surgical and family history all reviewed in electronic medical record.   Review of Systems: No headache, visual changes, nausea,  vomiting, diarrhea, constipation, dizziness, abdominal pain, skin rash, fevers, chills, night sweats, weight loss, swollen lymph nodes, body aches, joint swelling, muscle aches, chest pain, shortness of breath, mood changes.   Objective Blood pressure 136/82, pulse 72, weight 179 lb (81.194 kg).  General: No apparent distress alert and oriented x3 mood and affect normal, dressed appropriately.  HEENT: Pupils equal, extraocular movements intact  Respiratory: Patient's speak in full sentences and does not appear short of breath  Cardiovascular: No lower extremity edema, non tender, no erythema  Skin: Warm dry intact with no signs of infection or rash on extremities or on axial skeleton.  Abdomen: Soft nontender  Neuro: Cranial nerves II through XII are intact, neurovascularly intact in all extremities with 2+ DTRs and 2+ pulses.  Lymph: No lymphadenopathy of posterior or anterior cervical chain or axillae bilaterally.  Gait normal with good balance and coordination.  MSK:  Non tender with full range of motion and good stability and symmetric strength and tone of shoulders, elbows, wrist, hip, knee and ankles bilaterally.  Foot exam shows the patient does have a bunion on the first toe. Mild hammering of the fourth and fifth toes on the right foot. Mild splaying between the first and second toes. Minimal tenderness over the first toe today. This is worse than previous exam  Shoulder: Right Inspection reveals no abnormalities, atrophy or asymmetry. Really tender over the acromioclavicularjoint ROM is full in all planes passively. Rotator cuff strength normal throughout. Negative pivot signs  Speeds and Yergason's tests normal. Positive crossover test still present Normal scapular function observed. No painful arc and no drop arm sign. No apprehension sign Contralateral shoulder unremarkable.  Procedure: Real-time Ultrasound Guided Injection of right acromioclavicularjoint Device: GE Logiq E    Ultrasound guided injection is preferred based studies that show increased duration, increased effect, greater accuracy, decreased procedural pain, increased response rate with ultrasound guided versus blind injection.  Verbal informed consent obtained.  Time-out conducted.  Noted no overlying erythema, induration, or other signs of local infection.  Skin prepped in a sterile fashion.  Local anesthesia: Topical Ethyl chloride.  With sterile technique and under real time ultrasound guidance:  Joint visualized.  23g 1  inch needle inserted posterior approach. Pictures taken for needle placement. Patient did have injection of 1 cc of 0.5% Marcaine, and 1.0 cc of Kenalog 40 mg/dL. Completed without difficulty  Pain immediately resolved suggesting accurate placement of the medication.  Advised to call if fevers/chills, erythema, induration, drainage, or persistent bleeding.  Images permanently stored and available for review in the ultrasound unit.  Impression: Technically successful ultrasound guided injection.     Impression and Recommendations:     This [place] required medical decision making of moderate complexity.

## 2015-07-22 NOTE — Assessment & Plan Note (Signed)
Still believe the patient's pain is secondary to a first metatarsal disease the patient has good range of motion with no hallux rigidus. I believe the patient likely does have some gouty deposits. We will start him on allopurinol as well as indomethacin. Hopefully that this will be a short course of the indomethacin. We'll try to stay with the prednisone secondary to his glaucoma. Patient and will come back and see me again in 3-4 weeks. Patient also put in a postop boot for now.

## 2015-07-22 NOTE — Assessment & Plan Note (Signed)
Repeat injection given today. Hopefully this will be beneficial. X-rays ordered. Patient declined formal physical therapy. We'll continue with the home exercises. Has topical anti-inflammatories. Discuss if worsening symptoms we may need to seek another opinion about possible surgical intervention or formal physical therapy. Follow-up in 3-4 weeks.

## 2015-07-22 NOTE — Patient Instructions (Signed)
Good to see you  Injected the [facility] joint again that should help Post op boot daily for next 2 weeks Try to avoid heavy lifting for 2 weeks Xray downstairs today  Allopurinol 300mg  daily  Indomethacin 2 times daily for 5 days See me again in 2-3 weeks to make sure you are doing well.

## 2015-08-12 ENCOUNTER — Encounter: Payer: Self-pay | Admitting: Family Medicine

## 2015-08-12 ENCOUNTER — Ambulatory Visit (INDEPENDENT_AMBULATORY_CARE_PROVIDER_SITE_OTHER): Payer: BLUE CROSS/BLUE SHIELD | Admitting: Family Medicine

## 2015-08-12 VITALS — BP 120/80 | HR 64 | Wt 180.0 lb

## 2015-08-12 DIAGNOSIS — M79672 Pain in left foot: Secondary | ICD-10-CM

## 2015-08-12 DIAGNOSIS — M19019 Primary osteoarthritis, unspecified shoulder: Secondary | ICD-10-CM

## 2015-08-12 DIAGNOSIS — M129 Arthropathy, unspecified: Secondary | ICD-10-CM

## 2015-08-12 NOTE — Assessment & Plan Note (Signed)
Improving slowly. We may need to consider laboratory completely clarified patient's uric acid levels. Patient will continue on the allopurinol for now.

## 2015-08-12 NOTE — Assessment & Plan Note (Signed)
Patient has made improvement. We discussed icing regimen. We discussed which activities to do in which ones to avoid. Patient will continue to try to be active. Patient has any icing. I do think that some underlying increase in uric acid could be also contribute in. Patient will follow-up again at any worsening symptoms.

## 2015-08-12 NOTE — Patient Instructions (Addendum)
Good to see yo u I am glad we are making some strides If it was the nortriptyline or the zanaflex can try one more time but if it makes you  Loopy then stop it I would try the turmeric 500mg  twice daily to see if it helps I would expect you to continue to improve for the next 3 months.  Can repeat the shoulder every 3 months if needed See me again in 8-10 weeks to make sure you are dong well.

## 2015-08-12 NOTE — Progress Notes (Signed)
  [name] [place], [place] [address] Phone: [phone] Subjective:    CC: right shoulder pain f/u  [id number] Armond Semel is a 50 y.o. male coming in with complaint of right shoulder pain. Found to have a subacromial bursitis as well as a acromioclavicular arthritis. injection at last follow-up. Since then he is approximately 70% better. Still having some discomfort. X-rays were ordered and independently visualized by me showing the patient does have moderate arthritic changes of the glenohumeral joint as well as severe arthritic changes of the acromial clavicular joint. Seems reasonable. Not giving him as much problems with his work.  Patient is also complaining of left toe pain. There is concerned the patient may have some gouty deposits in the toe. Starting on the noticing every week any is making approximate 5% improving. Still some soreness. Has not associated with any specific foods at this moment. Also no significant activities.    Past Medical History  Diagnosis Date  . Left elbow pain 2011    tennis elbow  . Allergic rhinitis, cause unspecified 09/05/2013   No past surgical history on file. Social History  Substance Use Topics  . Smoking status: Never Smoker   . Smokeless tobacco: Never Used  . Alcohol Use: 0.0 oz/week    0 Standard drinks or equivalent per week     Comment: rare   No Known Allergies Family History  Problem Relation Age of Onset  . Hypertension Father      Past medical history, social, surgical and family history all reviewed in electronic medical record.   Review of Systems: No headache, visual changes, nausea, vomiting, diarrhea, constipation, dizziness, abdominal pain, skin rash, fevers, chills, night sweats, weight loss, swollen lymph nodes, body aches, joint swelling, muscle aches, chest pain, shortness of breath, mood changes.   Objective Blood pressure 120/80, pulse 64, weight 180 lb (81.647 kg).   General: No apparent distress alert and oriented x3 mood and affect normal, dressed appropriately.  HEENT: Pupils equal, extraocular movements intact  Respiratory: Patient's speak in full sentences and does not appear short of breath  Cardiovascular: No lower extremity edema, non tender, no erythema  Skin: Warm dry intact with no signs of infection or rash on extremities or on axial skeleton.  Abdomen: Soft nontender  Neuro: Cranial nerves II through XII are intact, neurovascularly intact in all extremities with 2+ DTRs and 2+ pulses.  Lymph: No lymphadenopathy of posterior or anterior cervical chain or axillae bilaterally.  Gait normal with good balance and coordination.  MSK:  Non tender with full range of motion and good stability and symmetric strength and tone of shoulders, elbows, wrist, hip, knee and ankles bilaterally.  Foot exam shows the patient does have a bunion on the first toe. Mild hammering of the fourth and fifth toes on the right foot. Mild splaying between the first and second toes. No tenderness over the first toe today.  Shoulder: Right Inspection reveals no abnormalities, atrophy or asymmetry. Minimal tenderness over the acromial clavicular joint.this is a Improvement. ROM is full in all planes passively. Rotator cuff strength normal throughout. Negative pivot signs Speeds and Yergason's tests normal. Positive crossover test present but improved Normal scapular function observed. No painful arc and no drop arm sign. No apprehension sign Contralateral shoulder unremarkable.       Impression and Recommendations:     This Walen required medical decision making of moderate complexity.

## 2015-09-10 ENCOUNTER — Ambulatory Visit: Payer: PRIVATE HEALTH INSURANCE | Admitting: Internal Medicine

## 2015-11-04 ENCOUNTER — Telehealth: Payer: Self-pay | Admitting: Internal Medicine

## 2015-11-04 NOTE — Telephone Encounter (Signed)
Pt come by and needs refill on his Hydrocodon,

## 2015-11-06 ENCOUNTER — Emergency Department (HOSPITAL_COMMUNITY): Payer: BLUE CROSS/BLUE SHIELD

## 2015-11-06 ENCOUNTER — Emergency Department (HOSPITAL_COMMUNITY)
Admission: EM | Admit: 2015-11-06 | Discharge: 2015-11-06 | Disposition: A | Discharge status: Home / Self Care | Payer: BLUE CROSS/BLUE SHIELD | Attending: Emergency Medicine | Admitting: Emergency Medicine

## 2015-11-06 ENCOUNTER — Encounter (HOSPITAL_COMMUNITY): Payer: Self-pay | Admitting: *Deleted

## 2015-11-06 ENCOUNTER — Telehealth: Payer: Self-pay | Admitting: Internal Medicine

## 2015-11-06 DIAGNOSIS — Z79899 Other long term (current) drug therapy: Secondary | ICD-10-CM | POA: Insufficient documentation

## 2015-11-06 DIAGNOSIS — K5641 Fecal impaction: Secondary | ICD-10-CM

## 2015-11-06 DIAGNOSIS — K5900 Constipation, unspecified: Secondary | ICD-10-CM | POA: Diagnosis not present

## 2015-11-06 HISTORY — DX: Irritable bowel syndrome, unspecified: K58.9

## 2015-11-06 MED ORDER — ONDANSETRON 4 MG PO TBDP
8.0000 mg | ORAL_TABLET | Freq: Once | ORAL | Status: AC
Start: 2015-11-06 — End: 2015-11-06
  Administered 2015-11-06: 8 mg via ORAL
  Filled 2015-11-06: qty 2

## 2015-11-06 NOTE — ED Provider Notes (Signed)
CSN: [id number]     Arrival date & time 11/06/15  1527 History   First MD Initiated Contact with Patient 11/06/15 1706     Chief Complaint  Patient presents with  . Constipation      HPI Pt was seen at 1715. Per pt, c/o gradual onset and persistence of constant constipation for the past 1 week. Pt states the constipation began after he began taking norco for dental pain. Pt states he tried taking an enema yesterday and today "but it didn't stay in." Pt states he took OTC Metamucil without having a BM. Pt states he now has rectal pain and "feels it's up there" but is unable to pass the stool "because it's too big." Has been associated with nausea. Denies abd pain, no back pain, no rectal bleeding, no black or bloody stools.    Past Medical History  Diagnosis Date  . Left elbow pain 2011    tennis elbow  . Allergic rhinitis, cause unspecified 09/05/2013  . IBS (irritable bowel syndrome)    History reviewed. No pertinent past surgical history. Family History  Problem Relation Age of Onset  . Hypertension Father    Social History  Substance Use Topics  . Smoking status: Never Smoker   . Smokeless tobacco: Never Used  . Alcohol Use: 0.0 oz/week    0 Standard drinks or equivalent per week     Comment: rare    Review of Systems ROS: Statement: All systems negative except as marked or noted in the HPI; Constitutional: Negative for fever and chills. ; ; Eyes: Negative for eye pain, redness and discharge. ; ; ENMT: Negative for ear pain, hoarseness, nasal congestion, sinus pressure and sore throat. ; ; Cardiovascular: Negative for chest pain, palpitations, diaphoresis, dyspnea and peripheral edema. ; ; Respiratory: Negative for cough, wheezing and stridor. ; ; Gastrointestinal: +nausea, constipation. Negative for vomiting, diarrhea, abdominal pain, blood in stool, hematemesis, jaundice and rectal bleeding. . ; ; Genitourinary: Negative for dysuria, flank pain and hematuria. ; ; Musculoskeletal:  Negative for back pain and neck pain. Negative for swelling and trauma.; ; Skin: Negative for pruritus, rash, abrasions, blisters, bruising and skin lesion.; ; Neuro: Negative for headache, lightheadedness and neck stiffness. Negative for weakness, altered level of consciousness, altered mental status, extremity weakness, paresthesias, involuntary movement, seizure and syncope.      Allergies  Review of patient's allergies indicates no known allergies.  Home Medications   Prior to Admission medications   Medication Sig Start Date End Date Taking? Authorizing Provider  allopurinol (ZYLOPRIM) 300 MG tablet Take 1 tablet (300 mg total) by mouth daily. 07/22/15   Lyndal Pulley, DO  fluticasone (FLONASE) 50 MCG/ACT nasal spray Place 2 sprays into both nostrils daily. 06/29/15   Mosie Lukes, MD  HYDROcodone-acetaminophen (NORCO/VICODIN) 5-325 MG tablet Take 1 tablet by mouth daily. Take 1-2 bid prn pain 06/14/15   Cassandria Anger, MD  ibuprofen (ADVIL,MOTRIN) 600 MG tablet Take 1 tablet (600 mg total) by mouth every 8 (eight) hours as needed for moderate pain. 03/13/15   Cassandria Anger, MD  indomethacin (INDOCIN SR) 75 MG CR capsule Take 1 capsule (75 mg total) by mouth 2 (two) times daily with a meal. 07/22/15   Lyndal Pulley, DO  pseudoephedrine (SUDAFED) 120 MG 12 hr tablet Take 1 tablet (120 mg total) by mouth 2 (two) times daily as needed for congestion. 05/26/13   Evie Lacks Plotnikov, MD  tiZANidine (ZANAFLEX) 4 MG tablet TAKE 1  TABLET BY MOUTH EVERY 6 HOURS AS NEEDED FOR MUSCLE SPASMS. 09/12/14   Laurey Morale, MD  TRAVATAN Z 0.004 % SOLN ophthalmic solution Place 1 drop into both eyes at bedtime. 01/05/15   Historical Provider, MD  triamcinolone ointment (KENALOG) 0.5 % Apply topically 2 (two) times daily. 01/12/13   Evie Lacks Plotnikov, MD   BP 138/96 mmHg  Pulse 73  Temp(Src) 98 F (36.7 C) (Oral)  Resp 20  SpO2 100% Physical Exam  1720: Physical examination:  Nursing notes  reviewed; Vital signs and O2 SAT reviewed;  Constitutional: Well developed, Well nourished, Well hydrated, In no acute distress; Head:  Normocephalic, atraumatic; Eyes: EOMI, PERRL, No scleral icterus; ENMT: Mouth and pharynx normal, Mucous membranes moist; Neck: Supple, Full range of motion, No lymphadenopathy; Cardiovascular: Regular rate and rhythm, No gallop; Respiratory: Breath sounds clear & equal bilaterally, No wheezes.  Speaking full sentences with ease, Normal respiratory effort/excursion; Chest: Nontender, Movement normal; Abdomen: Soft, Nontender, Nondistended, Normal bowel sounds. Rectal exam performed w/permission of pt and ED RN chaperone present. Anal tone normal. +fecal impaction. No fissures, no external hemorrhoids, no palp masses.; Genitourinary: No CVA tenderness; Extremities: Pulses normal, No tenderness, No edema, No calf edema or asymmetry.; Neuro: AA&Ox3, Major CN grossly intact.  Speech clear. No gross focal motor or sensory deficits in extremities. Climbs on and off stretcher easily by himself. Gait steady.; Skin: Color normal, Warm, Dry.; Psych:  Anxious.   ED Course  Procedures (including critical care time) Labs Review  Imaging Review  I have personally reviewed and evaluated these images and lab results as part of my medical decision-making.   EKG Interpretation None      MDM  MDM Reviewed: previous chart, nursing note and vitals Interpretation: x-ray      Dg Abd Acute W/chest 11/06/2015  CLINICAL DATA:  Rectal pain and constipation over the last several days. EXAM: DG ABDOMEN ACUTE W/ 1V CHEST COMPARISON:  Chest radiography 05/01/2013 FINDINGS: There is large amount of fecal matter throughout the colon. Small bowel gas pattern is normal. No abnormal calcifications. Mild spinal curvature. No acute bone finding. One-view chest shows normal heart and mediastinal shadows. The lungs are clear. No effusions. No free air. No bony abnormalities. IMPRESSION: Large amount  of fecal matter throughout the colon. Small bowel gas pattern normal. Electronically Signed   By: Nelson Chimes M.D.   On: 11/06/2015 18:14    1900:  Fecal impaction on exam; pt unable to tolerate disimpaction, but I was able to soften stool. Abd remains benign. Pt states he did pass "some liquid BM" after disimpaction. Pt very very anxious. Pt reassured. States he is ready to go home now. Dx and testing d/w pt.  Questions answered.  Verb understanding, agreeable to d/c home with outpt f/u.   Francine Graven, DO 11/07/15 1520

## 2015-11-06 NOTE — ED Notes (Signed)
Pt. Still C/O of very painful constipation. Sts. He had a somewhat normal BM this AM @ 930 before a root canal.

## 2015-11-06 NOTE — Discharge Instructions (Signed)
Take over the counter laxative (such as exlax, milk of magnesia, senokot) AND a dulcolax suppository (or enema) today and repeat both tomorrow.  Take an over the counter stool softener (such as colace), as directed on packaging, for the next month.  Continue to take your usual prescriptions as previously directed.  Call your regular medical doctor tomorrow to schedule a follow up appointment within the next week.  Return to the Emergency Department immediately if worsening.

## 2015-11-06 NOTE — Telephone Encounter (Signed)
Patient Name: Zachary George  DOB: [birth year]    Initial Comment Caller states he had a root canal today, has been on pain medicine. Did a enema for constipation. He's having pain in his rectum. Severe abdominal and rectum pain. Nausea. Can't sit down.   Nurse Assessment  Nurse: Leilani Merl, RN, Heather Date/Time (Eastern Time): 11/06/2015 2:54:37 PM  Confirm and document reason for call. If symptomatic, describe symptoms. You must click the next button to save text entered. ---Caller states he had a root canal today, has been on pain medicine for a week. Did a enema for constipation yesterday. He's having pain in his rectum. Severe abdominal and rectum pain. Nausea. Can't sit down.  Has the patient traveled out of the country within the last 30 days? ---Not Applicable  Does the patient have any new or worsening symptoms? ---Yes  Will a triage be completed? ---Yes  Related visit to physician within the last 2 weeks? ---No  Does the PT have any chronic conditions? (i.e. diabetes, asthma, etc.) ---Yes  List chronic conditions. ---See MR  Is this a behavioral health or substance abuse call? ---No     Guidelines    Guideline Title Affirmed Question Affirmed Notes  Rectal Symptoms [1] Sudden onset rectal pain AND [2] constipated (straining, rectal pressure or fullness) AND [3] NOT better after SITZ bath, suppository or enema    Final Disposition User   See Physician within 4 Hours (or PCP triage) Leilani Merl, RN, [facility] - ED   Disagree/Comply: Comply

## 2015-11-06 NOTE — ED Notes (Signed)
Pt in c/o rectal pain and constipation, went to dentist last week for toothache and was started on narcotic pain medication, tried enema at home with no relief, tonight pain increased and c/o nausea

## 2015-11-06 NOTE — ED Notes (Signed)
Pt  Was given dc material and reviewed information with RN. Pt in stable condition and ambulatory when dc. NAD Noted

## 2015-11-11 MED ORDER — HYDROCODONE-ACETAMINOPHEN 5-325 MG PO TABS: 1.0000 | ORAL_TABLET | Freq: Two times a day (BID) | ORAL | Status: DC | PRN

## 2015-11-11 NOTE — Telephone Encounter (Signed)
Rx printed/signed/upfront for p/u. Left detailed mess informing pt.  

## 2015-11-11 NOTE — Telephone Encounter (Signed)
OK to fill this prescription with additional refills x0 Thank you!  

## 2015-12-09 ENCOUNTER — Encounter: Payer: Self-pay | Admitting: Internal Medicine

## 2015-12-09 ENCOUNTER — Ambulatory Visit (INDEPENDENT_AMBULATORY_CARE_PROVIDER_SITE_OTHER): Payer: BLUE CROSS/BLUE SHIELD | Admitting: Internal Medicine

## 2015-12-09 VITALS — BP 110/72 | HR 75 | Temp 98.2°F | Wt 172.9 lb

## 2015-12-09 DIAGNOSIS — R200 Anesthesia of skin: Secondary | ICD-10-CM | POA: Diagnosis not present

## 2015-12-09 DIAGNOSIS — R05 Cough: Secondary | ICD-10-CM | POA: Diagnosis not present

## 2015-12-09 DIAGNOSIS — H6982 Other specified disorders of Eustachian tube, left ear: Secondary | ICD-10-CM

## 2015-12-09 DIAGNOSIS — Z8709 Personal history of other diseases of the respiratory system: Secondary | ICD-10-CM | POA: Diagnosis not present

## 2015-12-09 DIAGNOSIS — R053 Chronic cough: Secondary | ICD-10-CM

## 2015-12-09 MED ORDER — HYDROCODONE-HOMATROPINE 5-1.5 MG/5ML PO SYRP: 5.0000 mL | ORAL_SOLUTION | ORAL | 0 refills | Status: AC | PRN

## 2015-12-09 MED ORDER — ALBUTEROL SULFATE HFA 108 (90 BASE) MCG/ACT IN AERS: 2.0000 | INHALATION_SPRAY | Freq: Four times a day (QID) | RESPIRATORY_TRACT | 1 refills | Status: DC | PRN

## 2015-12-09 MED ORDER — IPRATROPIUM-ALBUTEROL 0.5-2.5 (3) MG/3ML IN SOLN
3.0000 mL | Freq: Once | RESPIRATORY_TRACT | Status: AC
  Administered 2015-12-09: 3 mL via RESPIRATORY_TRACT

## 2015-12-09 MED ORDER — PREDNISONE 20 MG PO TABS
ORAL_TABLET | ORAL | 0 refills | Status: DC
Start: 2015-12-09 — End: 2016-01-21

## 2015-12-09 NOTE — Progress Notes (Signed)
Pre visit review using our clinic review tool, if applicable. No additional management support is needed unless otherwise documented below in the visit note.  Chief Complaint  Patient presents with  . Cough  . Ear Pain  . Generalized Body Aches  . Nasal Congestion    HPI: Zachary George 50 y.o.  sda appt   PCP NA comes  in with 2 weeks hx of cough spasmodic without fever poss  Onset like a cold cough  But no sinus pain  Pressure  Left ear hurts and pops recently  Cough tight ocass mucous  Concern he may be getting bronchitis.  In early July he had left face tooth pain. Ended up having a root canal treated for sinusitis with 2 separate antibiotics at some point. He also got thrush in his mouth treated with Diflucan. He has residual numbness in the left side of his tongue and decreased taste possibly from the injections or the antibiotics. In that setting he has had this cough irritable hacking. No fevers not using his Flonase every day. No history specifically of asthma no tobacco has been given an inhaler in the past when he had bronchitis. Has some colored phlegm at times but mostly he can't really get it out. Has at times   Tired of being up all night coughing  deslym not working   ROS: See pertinent positives and negatives per HPI.  Past Medical History:  Diagnosis Date  . Allergic rhinitis, cause unspecified 09/05/2013  . IBS (irritable bowel syndrome)   . Left elbow pain 2011   tennis elbow    Family History  Problem Relation Age of Onset  . Hypertension Father     Social History   Social History  . Marital status: Married    Spouse name: N/A  . Number of children: N/A  . Years of education: N/A   Social History Main Topics  . Smoking status: Never Smoker  . Smokeless tobacco: Never Used  . Alcohol use 0.0 oz/week     Comment: rare  . Drug use: No  . Sexual activity: Yes   Other Topics Concern  . None   Social History Narrative  . None    Outpatient  Medications Prior to Visit  Medication Sig Dispense Refill  . allopurinol (ZYLOPRIM) 300 MG tablet Take 1 tablet (300 mg total) by mouth daily. 30 tablet 6  . fluticasone (FLONASE) 50 MCG/ACT nasal spray Place 2 sprays into both nostrils daily. 16 g 0  . ibuprofen (ADVIL,MOTRIN) 600 MG tablet Take 1 tablet (600 mg total) by mouth every 8 (eight) hours as needed for moderate pain. 90 tablet 2  . indomethacin (INDOCIN SR) 75 MG CR capsule Take 1 capsule (75 mg total) by mouth 2 (two) times daily with a meal. 14 capsule 1  . pseudoephedrine (SUDAFED) 120 MG 12 hr tablet Take 1 tablet (120 mg total) by mouth 2 (two) times daily as needed for congestion. 20 tablet 0  . tiZANidine (ZANAFLEX) 4 MG tablet TAKE 1 TABLET BY MOUTH EVERY 6 HOURS AS NEEDED FOR MUSCLE SPASMS. 60 tablet 11  . TRAVATAN Z 0.004 % SOLN ophthalmic solution Place 1 drop into both eyes at bedtime.  6  . triamcinolone ointment (KENALOG) 0.5 % Apply topically 2 (two) times daily. 60 g 1  . HYDROcodone-acetaminophen (NORCO/VICODIN) 5-325 MG tablet Take 1-2 tablets by mouth 2 (two) times daily as needed for moderate pain. (Patient not taking: Reported on 12/09/2015) 60 tablet 0   No  facility-administered medications prior to visit.      EXAM:  BP 110/72 (BP Location: Right Arm, Patient Position: Sitting, Cuff Size: Normal)   Pulse 75   Temp 98.2 F (36.8 C) (Oral)   Wt 172 lb 14.4 oz (78.4 kg)   SpO2 98%   BMI 25.53 kg/m   Body mass index is 25.53 kg/m.  GENERAL: vitals reviewed and listed above, alert, oriented, appears well hydrated and in no acute distressWith intermittent repeated almost throat clearing cough like irritation. No shortness of breath but does have spasms of this. Of note after nebulizer with DuoNeb and this decreased dramatically. HEENT: atraumatic, conjunctiva  clear, no obvious abnormalities on inspection of external nose and ears  tms nl congested but no face tenderness OP : no lesion edema or exudate    NECK: no obvious masses on inspection palpation  LUNGS: clear to auscultation bilaterally, no wheezes, rales or rhonchi,  Better air  movement after neb.  CV: HRRR, no clubbing cyanosis or  peripheral edema nl cap refill  MS: moves all extremities without noticeable focal  abnormality   ASSESSMENT AND PLAN:  Discussed the following assessment and plan:  Cough, persistent - Plan: ipratropium-albuterol (DUONEB) 0.5-2.5 (3) MG/3ML nebulizer solution 3 mL  Eustachian tube dysfunction, left  Numbness of tongue  Hx of sinusitis Symptoms and exam reminiscent of upper airway irritability after infection versus an asthmatic bronchitis responsive to nebulizer. His cough is not a deep bronchial cough probably has upper airway irritability and a significant amount. At this time and didn't think antibiotics will be helpful. Continues inhaler chlorpheniramine at night cough medicine for comfort, albuterol as needed and get back on his Flonase after his prednisone burst. Follow up with his PCP office if needed after this. I don't think the treatments for his routine canal and sinusitis a month ago I related these symptoms. He has some residual left lateral tongue numbness. Warnings that prednisone could aggravate and makes rush come back. -Patient advised to return or notify health care team  if symptoms worsen ,persist or new concerns arise.  Patient Instructions  NO pneumonia  On exam  Cough sounds like an asthmatic bronchitis and or from post nasal drainage    If viral usually lasts 2-3 weeks but if getting worse may need to treat for sinusitis or irritable airway.   Begin nasal steroids suchas flonase or nasacort  We can do a short course of prendisone  o decrease inflammation Antibiotics dont usually help unless there is a sinusitis also .   Add chorpheneriamine at night  And can add hydrocodone cough if needed.  dont use cough  Drops but sugar free candy is ok .  somepeople have  irritable airway  Syndrome after an infection .  Can add antibiotic if we feel sinusitis is bacterial cause.       Zachary George. Zachary George M.D.

## 2015-12-09 NOTE — Patient Instructions (Addendum)
NO pneumonia  On exam  Cough sounds like an asthmatic bronchitis and or from post nasal drainage    If viral usually lasts 2-3 weeks but if getting worse may need to treat for sinusitis or irritable airway.   Begin nasal steroids suchas flonase or nasacort  We can do a short course of prendisone  o decrease inflammation Antibiotics dont usually help unless there is a sinusitis also .   Add chorpheneriamine at night  And can add hydrocodone cough if needed.  dont use cough  Drops but sugar free candy is ok .  somepeople have irritable airway  Syndrome after an infection .  Can add antibiotic if we feel sinusitis is bacterial cause.

## 2016-01-21 ENCOUNTER — Encounter: Payer: Self-pay | Admitting: Internal Medicine

## 2016-01-21 ENCOUNTER — Ambulatory Visit (INDEPENDENT_AMBULATORY_CARE_PROVIDER_SITE_OTHER)
Admission: RE | Admit: 2016-01-21 | Discharge: 2016-01-21 | Disposition: A | Discharge status: Home / Self Care | Payer: BLUE CROSS/BLUE SHIELD | Source: Ambulatory Visit | Attending: Internal Medicine | Admitting: Internal Medicine

## 2016-01-21 ENCOUNTER — Other Ambulatory Visit (INDEPENDENT_AMBULATORY_CARE_PROVIDER_SITE_OTHER): Payer: BLUE CROSS/BLUE SHIELD

## 2016-01-21 ENCOUNTER — Ambulatory Visit (INDEPENDENT_AMBULATORY_CARE_PROVIDER_SITE_OTHER): Payer: BLUE CROSS/BLUE SHIELD | Admitting: Internal Medicine

## 2016-01-21 DIAGNOSIS — R071 Chest pain on breathing: Secondary | ICD-10-CM

## 2016-01-21 DIAGNOSIS — R634 Abnormal weight loss: Secondary | ICD-10-CM | POA: Diagnosis not present

## 2016-01-21 DIAGNOSIS — R05 Cough: Secondary | ICD-10-CM

## 2016-01-21 DIAGNOSIS — R079 Chest pain, unspecified: Secondary | ICD-10-CM | POA: Diagnosis not present

## 2016-01-21 DIAGNOSIS — R059 Cough, unspecified: Secondary | ICD-10-CM

## 2016-01-21 LAB — BASIC METABOLIC PANEL
BUN: 18 mg/dL (ref 6–23)
CO2: 29 mEq/L (ref 19–32)
CREATININE: 0.92 mg/dL (ref 0.40–1.50)
Calcium: 9.1 mg/dL (ref 8.4–10.5)
Chloride: 107 mEq/L (ref 96–112)
GFR: 93.14 mL/min (ref >60.00)
GLUCOSE: 86 mg/dL (ref 70–99)
POTASSIUM: 3.4 meq/L — AB (ref 3.5–5.1)
Sodium: 141 mEq/L (ref 135–145)

## 2016-01-21 LAB — CBC WITH DIFFERENTIAL/PLATELET
BASOS PCT: 0.3 % (ref 0.0–3.0)
Basophils Absolute: 0 10*3/uL (ref 0.0–0.1)
EOS ABS: 0.1 10*3/uL (ref 0.0–0.7)
EOS PCT: 1.5 % (ref 0.0–5.0)
HCT: 42.8 % (ref 39.0–52.0)
HEMOGLOBIN: 14.8 g/dL (ref 13.0–17.0)
LYMPHS ABS: 1.7 10*3/uL (ref 0.7–4.0)
Lymphocytes Relative: 24.3 % (ref 12.0–46.0)
MCHC: 34.7 g/dL (ref 30.0–36.0)
MCV: 87.2 fl (ref 78.0–100.0)
MONO ABS: 0.5 10*3/uL (ref 0.1–1.0)
Monocytes Relative: 7 % (ref 3.0–12.0)
NEUTROS PCT: 66.9 % (ref 43.0–77.0)
Neutro Abs: 4.7 10*3/uL (ref 1.4–7.7)
Platelets: 223 10*3/uL (ref 150.0–400.0)
RBC: 4.91 Mil/uL (ref 4.22–5.81)
RDW: 14 % (ref 11.5–15.5)
WBC: 7 10*3/uL (ref 4.0–10.5)

## 2016-01-21 LAB — HEPATIC FUNCTION PANEL
ALT: 16 U/L (ref 0–53)
AST: 14 U/L (ref 0–37)
Albumin: 4.4 g/dL (ref 3.5–5.2)
Alkaline Phosphatase: 54 U/L (ref 39–117)
Bilirubin, Direct: 0.1 mg/dL (ref 0.0–0.3)
TOTAL PROTEIN: 6.6 g/dL (ref 6.0–8.3)
Total Bilirubin: 0.6 mg/dL (ref 0.2–1.2)

## 2016-01-21 LAB — SEDIMENTATION RATE: Sed Rate: 1 mm/hr (ref 0–15)

## 2016-01-21 LAB — VITAMIN B12: VITAMIN B 12: 817 pg/mL (ref 211–911)

## 2016-01-21 LAB — TSH: TSH: 2.02 u[IU]/mL (ref 0.35–4.50)

## 2016-01-21 LAB — D-DIMER, QUANTITATIVE (NOT AT ARMC)

## 2016-01-21 MED ORDER — NAPROXEN 500 MG PO TABS: 500.0000 mg | ORAL_TABLET | Freq: Two times a day (BID) | ORAL | 0 refills | Status: DC

## 2016-01-21 MED ORDER — HYDROCODONE-ACETAMINOPHEN 5-325 MG PO TABS: 1.0000 | ORAL_TABLET | Freq: Two times a day (BID) | ORAL | 0 refills | Status: DC | PRN

## 2016-01-21 NOTE — Assessment & Plan Note (Signed)
CXR

## 2016-01-21 NOTE — Assessment & Plan Note (Signed)
CXR Naproxen

## 2016-01-21 NOTE — Progress Notes (Signed)
Subjective:  Patient ID: Zachary George, male    DOB: 03-Jun-1965  Age: 50 y.o. MRN: [id number]  CC: Chest Pain (Left side of ribs from coughing ) and Cough (productive at times from about 6 wks ago)   HPI [name] presents for not feeling well since July 4th. He had a root canal and a sinusitis - took abx and prednisone Tongue is numb on the R. He had thrush. He had bronchitis - Dr Regis Bill. C/o wt loss  Outpatient Medications Prior to Visit  Medication Sig Dispense Refill  . albuterol (PROVENTIL HFA;VENTOLIN HFA) 108 (90 Base) MCG/ACT inhaler Inhale 2 puffs into the lungs every 6 (six) hours as needed for wheezing or shortness of breath. 1 Inhaler 1  . fluticasone (FLONASE) 50 MCG/ACT nasal spray Place 2 sprays into both nostrils daily. 16 g 0  . HYDROcodone-acetaminophen (NORCO/VICODIN) 5-325 MG tablet Take 1-2 tablets by mouth 2 (two) times daily as needed for moderate pain. 60 tablet 0  . ibuprofen (ADVIL,MOTRIN) 600 MG tablet Take 1 tablet (600 mg total) by mouth every 8 (eight) hours as needed for moderate pain. 90 tablet 2  . indomethacin (INDOCIN SR) 75 MG CR capsule Take 1 capsule (75 mg total) by mouth 2 (two) times daily with a meal. 14 capsule 1  . pseudoephedrine (SUDAFED) 120 MG 12 hr tablet Take 1 tablet (120 mg total) by mouth 2 (two) times daily as needed for congestion. 20 tablet 0  . tiZANidine (ZANAFLEX) 4 MG tablet TAKE 1 TABLET BY MOUTH EVERY 6 HOURS AS NEEDED FOR MUSCLE SPASMS. 60 tablet 11  . TRAVATAN Z 0.004 % SOLN ophthalmic solution Place 1 drop into both eyes at bedtime.  6  . triamcinolone ointment (KENALOG) 0.5 % Apply topically 2 (two) times daily. 60 g 1  . allopurinol (ZYLOPRIM) 300 MG tablet Take 1 tablet (300 mg total) by mouth daily. (Patient not taking: Reported on 01/21/2016) 30 tablet 6  . predniSONE (DELTASONE) 20 MG tablet Take 3 po qd for 2 days then 2 po qd for 3 days,or as directed (Patient not taking: Reported on 01/21/2016) 12 tablet 0   No  facility-administered medications prior to visit.     ROS Review of Systems  Constitutional: Positive for fatigue and unexpected weight change. Negative for appetite change.  HENT: Negative for congestion, nosebleeds, sneezing, sore throat and trouble swallowing.   Eyes: Negative for itching and visual disturbance.  Respiratory: Negative for cough.   Cardiovascular: Negative for chest pain, palpitations and leg swelling.  Gastrointestinal: Negative for abdominal distention, blood in stool, diarrhea and nausea.  Genitourinary: Negative for frequency and hematuria.  Musculoskeletal: Negative for back pain, gait problem, joint swelling and neck pain.  Skin: Negative for rash.  Neurological: Negative for dizziness, tremors, speech difficulty and weakness.  Psychiatric/Behavioral: Negative for agitation, dysphoric mood, sleep disturbance and suicidal ideas. The patient is not nervous/anxious.     Objective:  BP 130/90   Pulse 62   Temp 98 F (36.7 C) (Oral)   Wt 169 lb (76.7 kg)   SpO2 98%   BMI 24.96 kg/m   BP Readings from Last 3 Encounters:  01/21/16 130/90  12/09/15 110/72  11/06/15 135/88    Wt Readings from Last 3 Encounters:  01/21/16 169 lb (76.7 kg)  12/09/15 172 lb 14.4 oz (78.4 kg)  08/12/15 180 lb (81.6 kg)    Physical Exam  Constitutional: He is oriented to person, place, and time. He appears well-developed. No distress.  NAD  HENT:  Mouth/Throat: Oropharynx is clear and moist.  Eyes: Conjunctivae are normal. Pupils are equal, round, and reactive to light.  Neck: Normal range of motion. No JVD present. No thyromegaly present.  Cardiovascular: Normal rate, regular rhythm, normal heart sounds and intact distal pulses.  Exam reveals no gallop and no friction rub.   No murmur heard. Pulmonary/Chest: Effort normal and breath sounds normal. No respiratory distress. He has no wheezes. He has no rales. He exhibits no tenderness.  Abdominal: Soft. Bowel sounds are  normal. He exhibits no distension and no mass. There is no tenderness. There is no rebound and no guarding.  Musculoskeletal: Normal range of motion. He exhibits no edema.  Lymphadenopathy:    He has no cervical adenopathy.  Neurological: He is alert and oriented to person, place, and time. He has normal reflexes. No cranial nerve deficit. He exhibits normal muscle tone. He displays a negative Romberg sign. Coordination and gait normal.  Skin: Skin is warm and dry. No rash noted.  Psychiatric: He has a normal mood and affect. His behavior is normal. Judgment and thought content normal.  L chest is very tender to palp and w/breathing  Lab Results  Component Value Date   WBC 7.6 05/22/2013   HGB 16.5 05/22/2013   HCT 47.7 05/22/2013   PLT 230.0 05/22/2013   GLUCOSE 95 05/22/2013   CHOL 217 (H) 05/22/2013   TRIG 48.0 05/22/2013   HDL 60.10 05/22/2013   LDLDIRECT 156.0 05/22/2013   LDLCALC 128 (H) 09/01/2011   ALT 43 05/22/2013   AST 24 05/22/2013   NA 139 05/22/2013   K 4.7 05/22/2013   CL 103 05/22/2013   CREATININE 1.0 05/22/2013   BUN 14 05/22/2013   CO2 28 05/22/2013   TSH 1.30 05/22/2013   PSA 1.45 09/01/2011    Dg Abd Acute W/chest  Result Date: 11/06/2015 CLINICAL DATA:  Rectal pain and constipation over the last several days. EXAM: DG ABDOMEN ACUTE W/ 1V CHEST COMPARISON:  Chest radiography 05/01/2013 FINDINGS: There is large amount of fecal matter throughout the colon. Small bowel gas pattern is normal. No abnormal calcifications. Mild spinal curvature. No acute bone finding. One-view chest shows normal heart and mediastinal shadows. The lungs are clear. No effusions. No free air. No bony abnormalities. IMPRESSION: Large amount of fecal matter throughout the colon. Small bowel gas pattern normal. Electronically Signed   By: Nelson Chimes M.D.   On: 11/06/2015 18:14    Assessment & Plan:   There are no diagnoses linked to this encounter. I have discontinued Mr. Dierker  predniSONE. I am also having him maintain his triamcinolone ointment, pseudoephedrine, tiZANidine, TRAVATAN Z, ibuprofen, fluticasone, allopurinol, indomethacin, HYDROcodone-acetaminophen, and albuterol.  No orders of the defined types were placed in this encounter.    Follow-up: No Follow-up on file.  Walker Kehr, MD

## 2016-01-21 NOTE — Assessment & Plan Note (Addendum)
Due to dental issues 7-9/17 Labs No HIV risk factors

## 2016-01-21 NOTE — Progress Notes (Signed)
Pre visit review using our clinic review tool, if applicable. No additional management support is needed unless otherwise documented below in the visit note. 

## 2016-01-23 ENCOUNTER — Other Ambulatory Visit: Payer: Self-pay | Admitting: Internal Medicine

## 2016-01-23 ENCOUNTER — Telehealth: Payer: Self-pay

## 2016-01-23 DIAGNOSIS — R05 Cough: Secondary | ICD-10-CM

## 2016-01-23 DIAGNOSIS — R071 Chest pain on breathing: Secondary | ICD-10-CM

## 2016-01-23 DIAGNOSIS — R059 Cough, unspecified: Secondary | ICD-10-CM

## 2016-01-23 NOTE — Telephone Encounter (Signed)
Patient called and said his l side of chest is still hurting. He still is coughing up stuff and no energy. He just does not feel better. He wants to know he if needs to come in and be seen again or if there is something else that he can take or do. Please Follow up with patient . Thank you.

## 2016-01-23 NOTE — Telephone Encounter (Signed)
Take Naproxen We can try another abx We will ask for a pulmonary consult  I'll refer Thx

## 2016-01-24 MED ORDER — LEVOFLOXACIN 500 MG PO TABS: 500.0000 mg | ORAL_TABLET | Freq: Every day | ORAL | 0 refills | Status: DC

## 2016-01-24 NOTE — Telephone Encounter (Signed)
Called and LVM for patient about doctors notes.

## 2016-01-24 NOTE — Telephone Encounter (Signed)
Pt was wondering if we can send the abx into [facility] on [address]. Please help today, he is going out of town today.

## 2016-01-24 NOTE — Addendum Note (Signed)
Addended by: Cassandria Anger on: 01/24/2016 02:17 PM   Modules accepted: Orders

## 2016-01-24 NOTE — Telephone Encounter (Signed)
Levaquin po x 10 d

## 2016-01-29 ENCOUNTER — Institutional Professional Consult (permissible substitution): Payer: BLUE CROSS/BLUE SHIELD | Admitting: Internal Medicine

## 2016-01-30 ENCOUNTER — Institutional Professional Consult (permissible substitution): Payer: BLUE CROSS/BLUE SHIELD | Admitting: Internal Medicine

## 2016-02-03 ENCOUNTER — Telehealth: Payer: Self-pay

## 2016-02-03 NOTE — Telephone Encounter (Signed)
Spoke with pt. He needs to reschedule his appointment with MW. Consult has been rescheduled to 02/06/16 at 10am. Nothing further was needed.

## 2016-02-06 ENCOUNTER — Ambulatory Visit (INDEPENDENT_AMBULATORY_CARE_PROVIDER_SITE_OTHER): Payer: BLUE CROSS/BLUE SHIELD | Admitting: Internal Medicine

## 2016-02-06 ENCOUNTER — Encounter: Payer: Self-pay | Admitting: Internal Medicine

## 2016-02-06 VITALS — BP 114/76 | HR 74 | Ht 69.0 in | Wt 172.0 lb

## 2016-02-06 DIAGNOSIS — R058 Other specified cough: Secondary | ICD-10-CM

## 2016-02-06 DIAGNOSIS — R05 Cough: Secondary | ICD-10-CM | POA: Diagnosis not present

## 2016-02-06 MED ORDER — PANTOPRAZOLE SODIUM 40 MG PO TBEC: 40.0000 mg | DELAYED_RELEASE_TABLET | Freq: Every day | ORAL | Status: DC

## 2016-02-06 MED ORDER — FAMOTIDINE 20 MG PO TABS: ORAL_TABLET | ORAL | 2 refills | Status: DC

## 2016-02-06 MED ORDER — PREDNISONE 10 MG PO TABS: ORAL_TABLET | ORAL | 0 refills | Status: DC

## 2016-02-06 NOTE — Patient Instructions (Signed)
The key to effective treatment for your cough is eliminating the non-stop cycle of cough you're stuck in long enough to let your airway heal completely and then see if there is anything still making you cough once you stop the cough suppression, but this should take no more than 5 days to figure out  First take delsym two tsp every 12 hours and supplement if needed with  vicodin  up to 2 every 4 hours to suppress the urge to cough at all or even clear your throat. Swallowing water or using ice chips/non mint and menthol containing candies (such as lifesavers or sugarless jolly ranchers) are also effective.  You should rest your voice and avoid activities that you know make you cough.  Once you have eliminated the cough for 3 straight days try reducing the vicodin  first,  then the delsym as tolerated.    Prednisone 10 mg take  4 each am x 2 days,   2 each am x 2 days,  1 each am x 2 days and stop (this is to eliminate allergies and inflammation from coughing)  Protonix (pantoprazole) Take 30-60 min before first meal of the day and Pepcid 20 mg one bedtime plus chlorpheniramine 4 mg x 2 at bedtime (both available over the counter)  until cough is completely gone for at least a week without the need for cough suppression  GERD (REFLUX)  is an extremely common cause of respiratory symptoms, many times with no significant heartburn at all.    It can be treated with medication, but also with lifestyle changes including avoidance of late meals, excessive alcohol, smoking cessation, and avoid fatty foods, chocolate, peppermint, colas, red wine, and acidic juices such as orange juice.  NO MINT OR MENTHOL PRODUCTS SO NO COUGH DROPS  USE HARD CANDY INSTEAD (jolley ranchers or Stover's or Lifesavers (all available in sugarless versions) NO OIL BASED VITAMINS - use powdered substitutes.  Return in 2 weeks if not better > if all better tell your friends!

## 2016-02-06 NOTE — Progress Notes (Signed)
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 Pulmonary Consult    Referred by Dr. Alain Marion. Pt c/o cough for the past 2 months. Cough is esp worse in the am and in the evening and is prod at times with clear sputum. He also c/o pain on is left side, starting to improve.   was coughing up green mucus until 2 weeks prior to OV   Cough was waking up but not now  Has norco for shoulder but not using  Cough worse with voice use and after stirring, does not typically awaken him and assoc x sev weeks with L cp only during coughing fit.  Has h/o gerd better since lost wt so not on suppressive rx   No obvious patterns in day to day or daytime variability or assoc sob (unless coughing)  or  excess/ purulent sputum or mucus plugs or hemoptysis or chest tightness, subjective wheeze or overt sinus or hb symptoms. No unusual exp hx or h/o childhood pna/ asthma or knowledge of premature birth.  Sleeping ok without nocturnal  or early am exacerbation  of respiratory  c/o's or need for noct saba. Also denies any obvious fluctuation of symptoms with weather or environmental changes or other aggravating or alleviating factors except as outlined above   Current Medications, Allergies, Complete Past Medical History, Past Surgical History, Family History, and Social History were reviewed in [employer] record.  ROS  The following are not  active complaints unless bolded sore throat, dysphagia, dental problems, itching, sneezing,  nasal congestion or excess/ purulent secretions, ear ache,   fever, chills, sweats, unintended wt loss, classically exertional cp,  orthopnea pnd or leg swelling, presyncope, palpitations, abdominal pain, anorexia, nausea, vomiting, diarrhea  or change in bowel or bladder habits, change in stools or urine, dysuria,hematuria,  rash, arthralgias, visual complaints, headache, numbness, weakness or ataxia or problems with walking or coordination,  change in mood/affect or memory.           Review of Systems     Objective:   Physical Exam    amb anxious very talkative wm with almost a push of speech intermixed with dry hacking coughing fits    Wt Readings from Last 3 Encounters:  02/06/16 172 lb (78 kg)  01/21/16 169 lb (76.7 kg)  12/09/15 172 lb 14.4 oz (78.4 kg)    Vital signs reviewed        HEENT: nl dentition, turbinates, and oropharynx. Nl external ear canals without cough reflex   NECK :  without JVD/Nodes/TM/ nl carotid upstrokes bilaterally   LUNGS: no acc muscle use,  Nl contour chest which is clear to A and P bilaterally without cough on insp or exp maneuvers   CV:  RRR  no s3 or murmur or increase in P2, no edema   ABD:  soft and nontender with  nl inspiratory excursion in the supine position. No bruits or organomegaly, bowel sounds nl  MS:  Nl gait/ ext warm without deformities, calf tenderness, cyanosis or clubbing No obvious joint restrictions   SKIN: warm and dry without lesions    NEURO:  alert, approp, nl sensorium with  no motor deficits      I personally reviewed images and agree with radiology impression as follows:  CXR:   01/21/16 No active cardiopulmonary disease.   Assessment:

## 2016-02-07 ENCOUNTER — Encounter: Payer: Self-pay | Admitting: Internal Medicine

## 2016-02-07 DIAGNOSIS — R05 Cough: Secondary | ICD-10-CM | POA: Insufficient documentation

## 2016-02-07 DIAGNOSIS — R058 Other specified cough: Secondary | ICD-10-CM | POA: Insufficient documentation

## 2016-02-07 NOTE — Assessment & Plan Note (Signed)
The most common causes of chronic cough in immunocompetent adults include the following: upper airway cough syndrome (UACS), previously referred to as postnasal drip syndrome (PNDS), which is caused by variety of rhinosinus conditions; (2) asthma; (3) GERD; (4) chronic bronchitis from cigarette smoking or other inhaled environmental irritants; (5) nonasthmatic eosinophilic bronchitis; and (6) bronchiectasis.   These conditions, singly or in combination, have accounted for up to 94% of the causes of chronic cough in prospective studies.   Other conditions have constituted no >6% of the causes in prospective studies These have included bronchogenic carcinoma, chronic interstitial pneumonia, sarcoidosis, left ventricular failure, ACEI-induced cough, and aspiration from a condition associated with pharyngeal dysfunction.    Chronic cough is often simultaneously caused by more than one condition. A single cause has been found from 38 to 82% of the time, multiple causes from 18 to 62%. Multiply caused cough has been the result of three diseases up to 42% of the time.       Based on hx and exam, this is most likely:  Classic Upper airway cough syndrome, so named because it's frequently impossible to sort out how much is  CR/sinusitis with freq throat clearing (which can be related to primary GERD)   vs  causing  secondary (" extra esophageal")  GERD from wide swings in gastric pressure that occur with throat clearing, often  promoting self use of mint and menthol lozenges that reduce the lower esophageal sphincter tone and exacerbate the problem further in a cyclical fashion.   These are the same pts (now being labeled as having "irritable larynx syndrome" by some cough centers) who not infrequently have a history of having failed to tolerate ace inhibitors,  dry powder inhalers or biphosphonates or report having atypical reflux symptoms that don't respond to standard doses of PPI , and are easily confused as  having aecopd or asthma flares by even experienced allergists/ pulmonologists.   The first step is to maximize acid suppression and eliminate cyclical coughing then regroup if the cough persists.  I had an extended discussion with the patient reviewing all relevant studies completed to date and  lasting 35 min/60 min ov   1) Explained: The standardized cough guidelines published in Chest by Lissa Morales in 2006 are still the best available and consist of a multiple step process (up to 12!) , not a single office visit,  and are intended  to address this problem logically,  with an alogrithm dependent on response to empiric treatment at  each progressive step  to determine a specific diagnosis with  minimal addtional testing needed. Therefore if adherence is an issue or can't be accurately verified,  it's very unlikely the standard evaluation and treatment will be successful here.    Furthermore, response to therapy (other than acute cough suppression, which should only be used short term with avoidance of narcotic containing cough syrups if possible), can be a gradual process for which the patient may perceive immediate benefit.  Unlike going to an eye doctor where the best perscription is almost always the first one and is immediately effective, this is almost never the [place] in the management of chronic cough syndromes. Therefore the patient needs to commit up front to consistently adhere to recommendations  for up to 6 weeks of therapy directed at the likely underlying problem(s) before the response can be reasonably evaluated.     2) Each maintenance medication was reviewed in detail including most importantly the difference between maintenance and prns  and under what circumstances the prns are to be triggered using an action plan format that is not reflected in the computer generated alphabetically organized AVS.    Please see instructions for details which were reviewed in writing and the  patient given a copy highlighting the part that I personally wrote and discussed at today's ov.   See instructions for specific recommendations which were reviewed directly with the patient who was given a copy with highlighter outlining the key components.

## 2016-02-16 ENCOUNTER — Other Ambulatory Visit: Payer: Self-pay | Admitting: Internal Medicine

## 2016-03-04 ENCOUNTER — Other Ambulatory Visit: Payer: Self-pay | Admitting: Internal Medicine

## 2016-03-04 MED ORDER — FAMOTIDINE 20 MG PO TABS: ORAL_TABLET | ORAL | 0 refills | Status: DC

## 2016-03-20 ENCOUNTER — Other Ambulatory Visit: Payer: Self-pay

## 2016-03-20 NOTE — Telephone Encounter (Signed)
Pt is rq rf of hydrocodone. pls advise.  Last filled 01/21/2016

## 2016-03-22 NOTE — Telephone Encounter (Signed)
OK to fill this prescription with additional refills x0 OV q 3 mo Thank you!  

## 2016-03-23 MED ORDER — HYDROCODONE-ACETAMINOPHEN 5-325 MG PO TABS: 1.0000 | ORAL_TABLET | Freq: Two times a day (BID) | ORAL | 0 refills | Status: DC | PRN

## 2016-03-23 NOTE — Telephone Encounter (Signed)
Rx printed/signed/upfront for p/u. Left detailed mess informing pt.  

## 2016-06-14 ENCOUNTER — Other Ambulatory Visit: Payer: Self-pay | Admitting: Internal Medicine

## 2016-06-27 IMAGING — DX DG SHOULDER 2+V*R*
3 series · 3 of 3 positions shown · non-contrast
Comparison: None.

CLINICAL DATA: Chronic pain.  No known injury.

EXAM:
RIGHT SHOULDER - 2+ VIEW

[grashey]
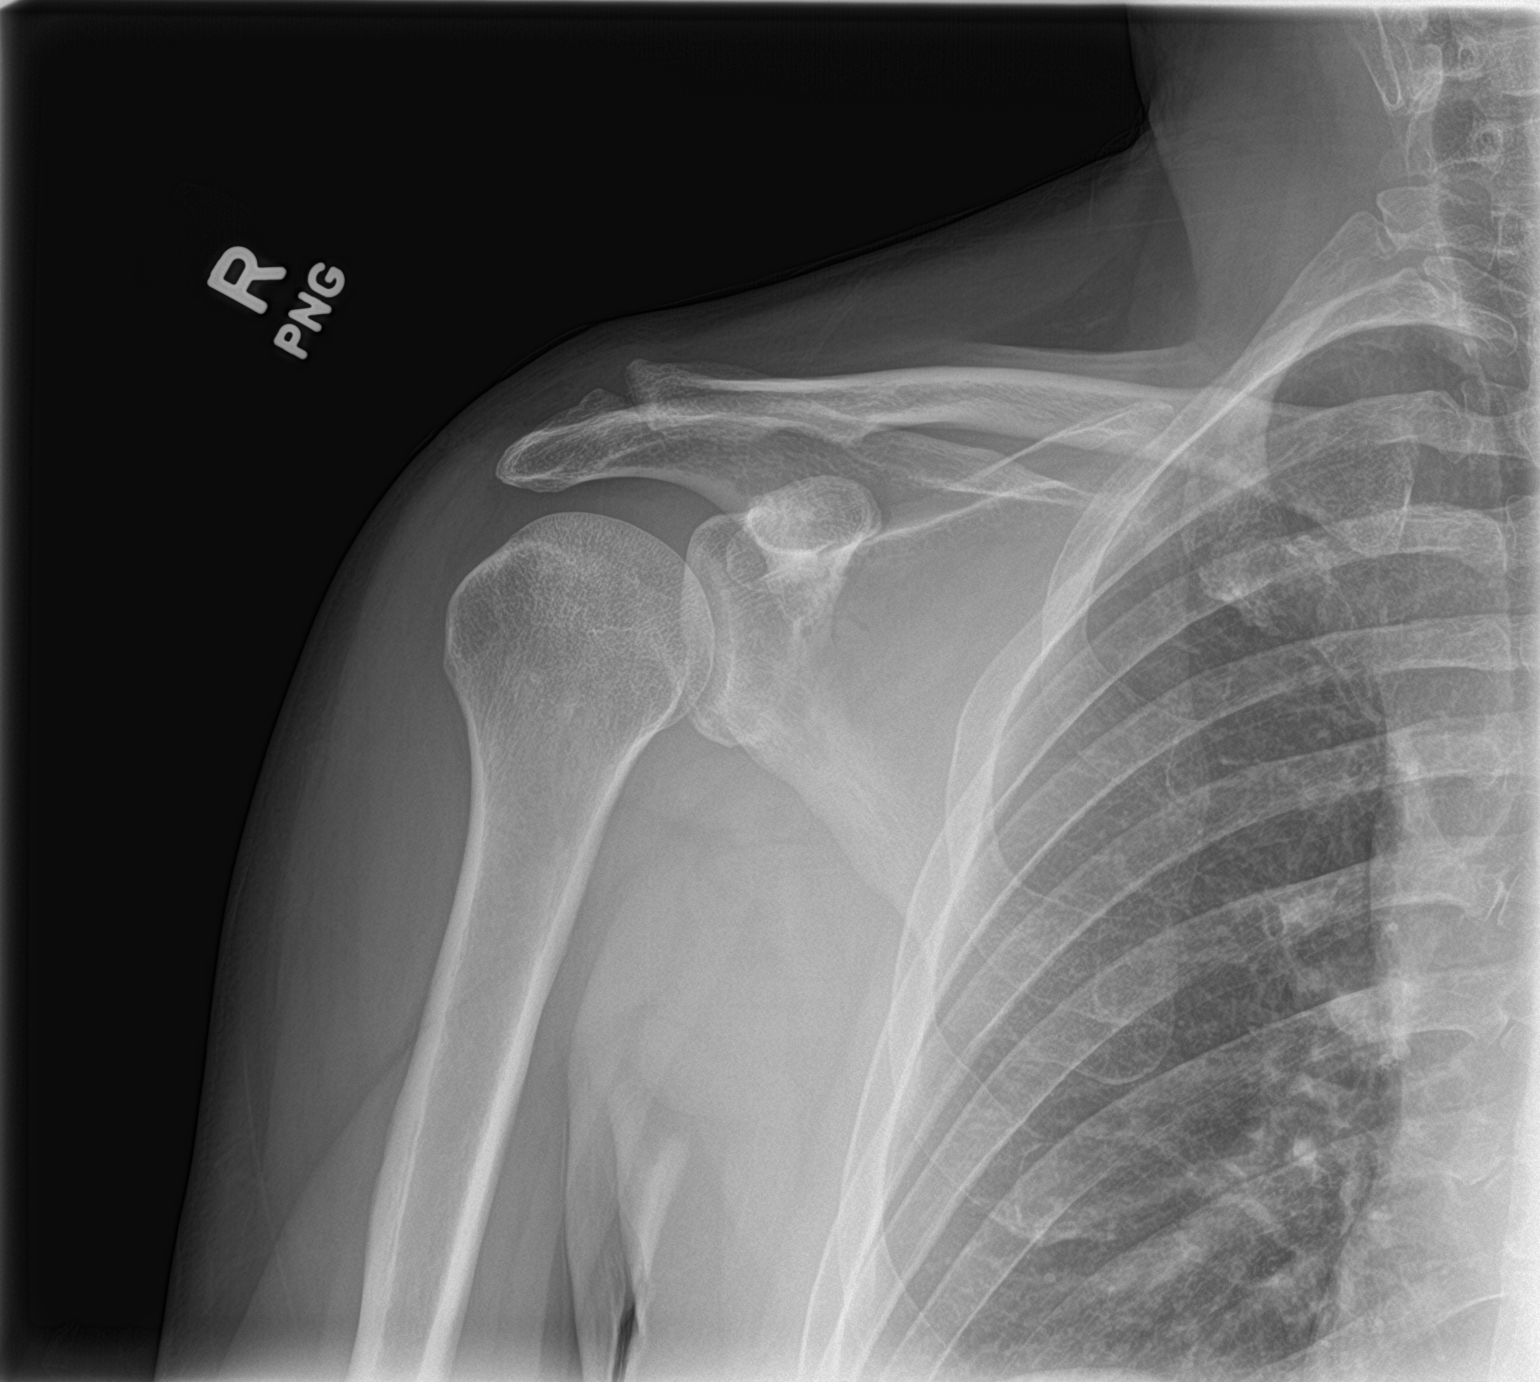

[y view]
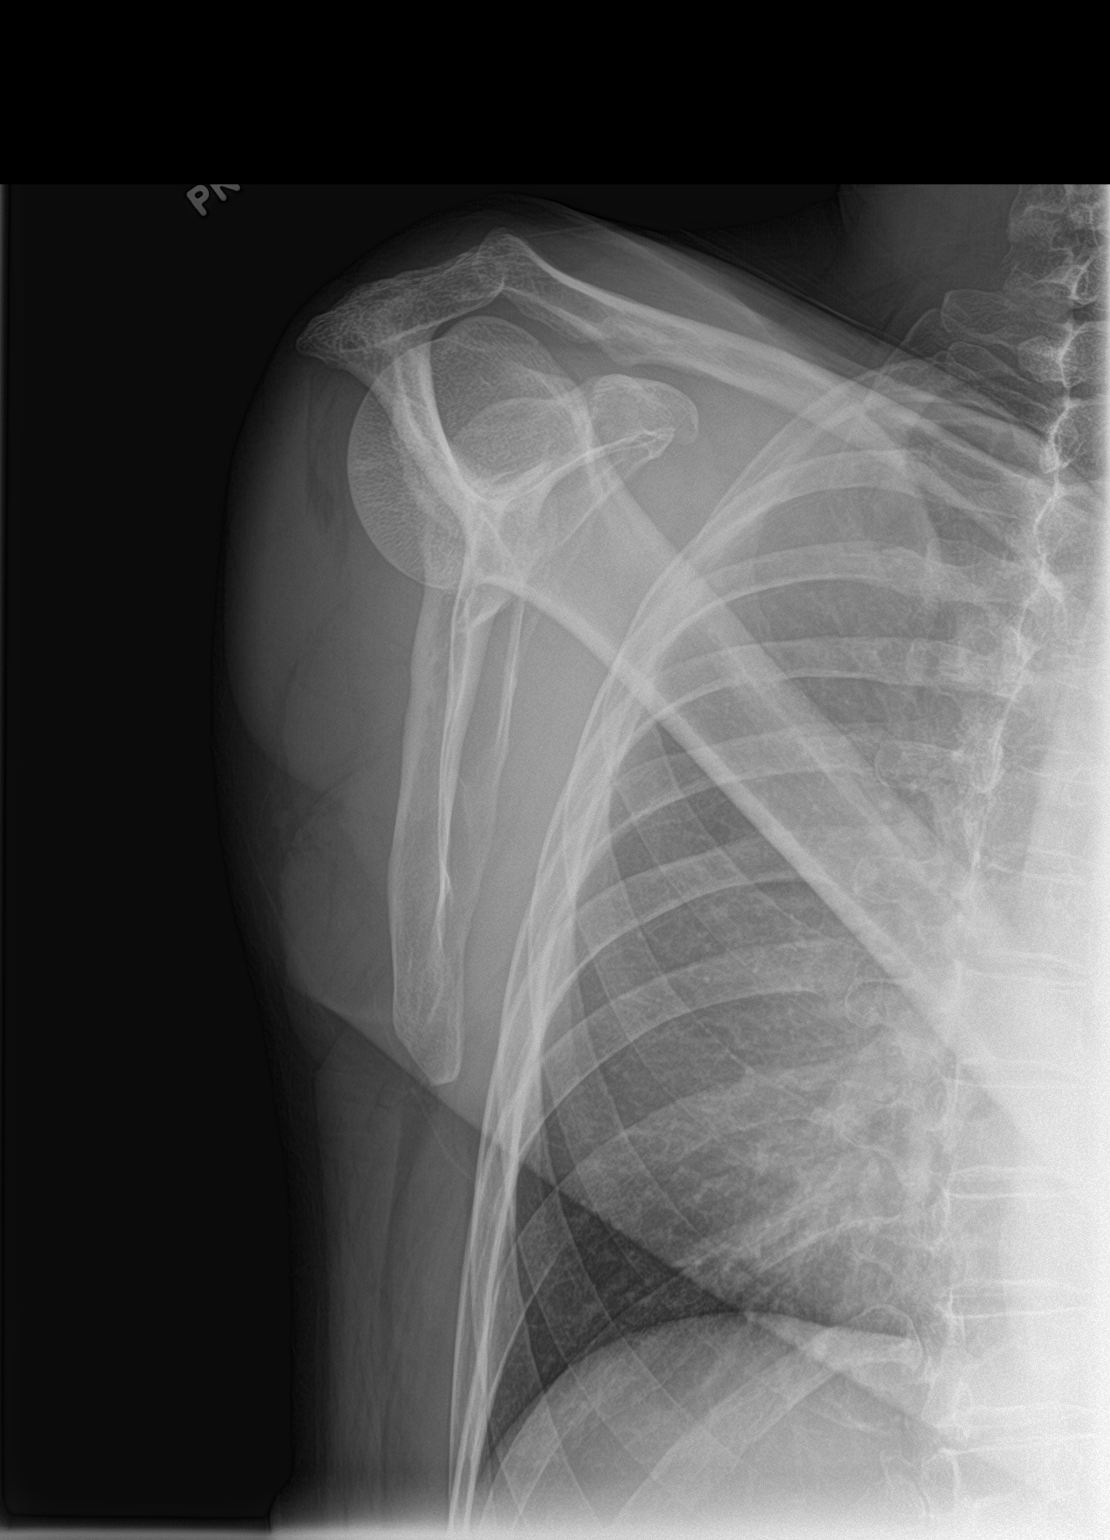

[shoulder axial]
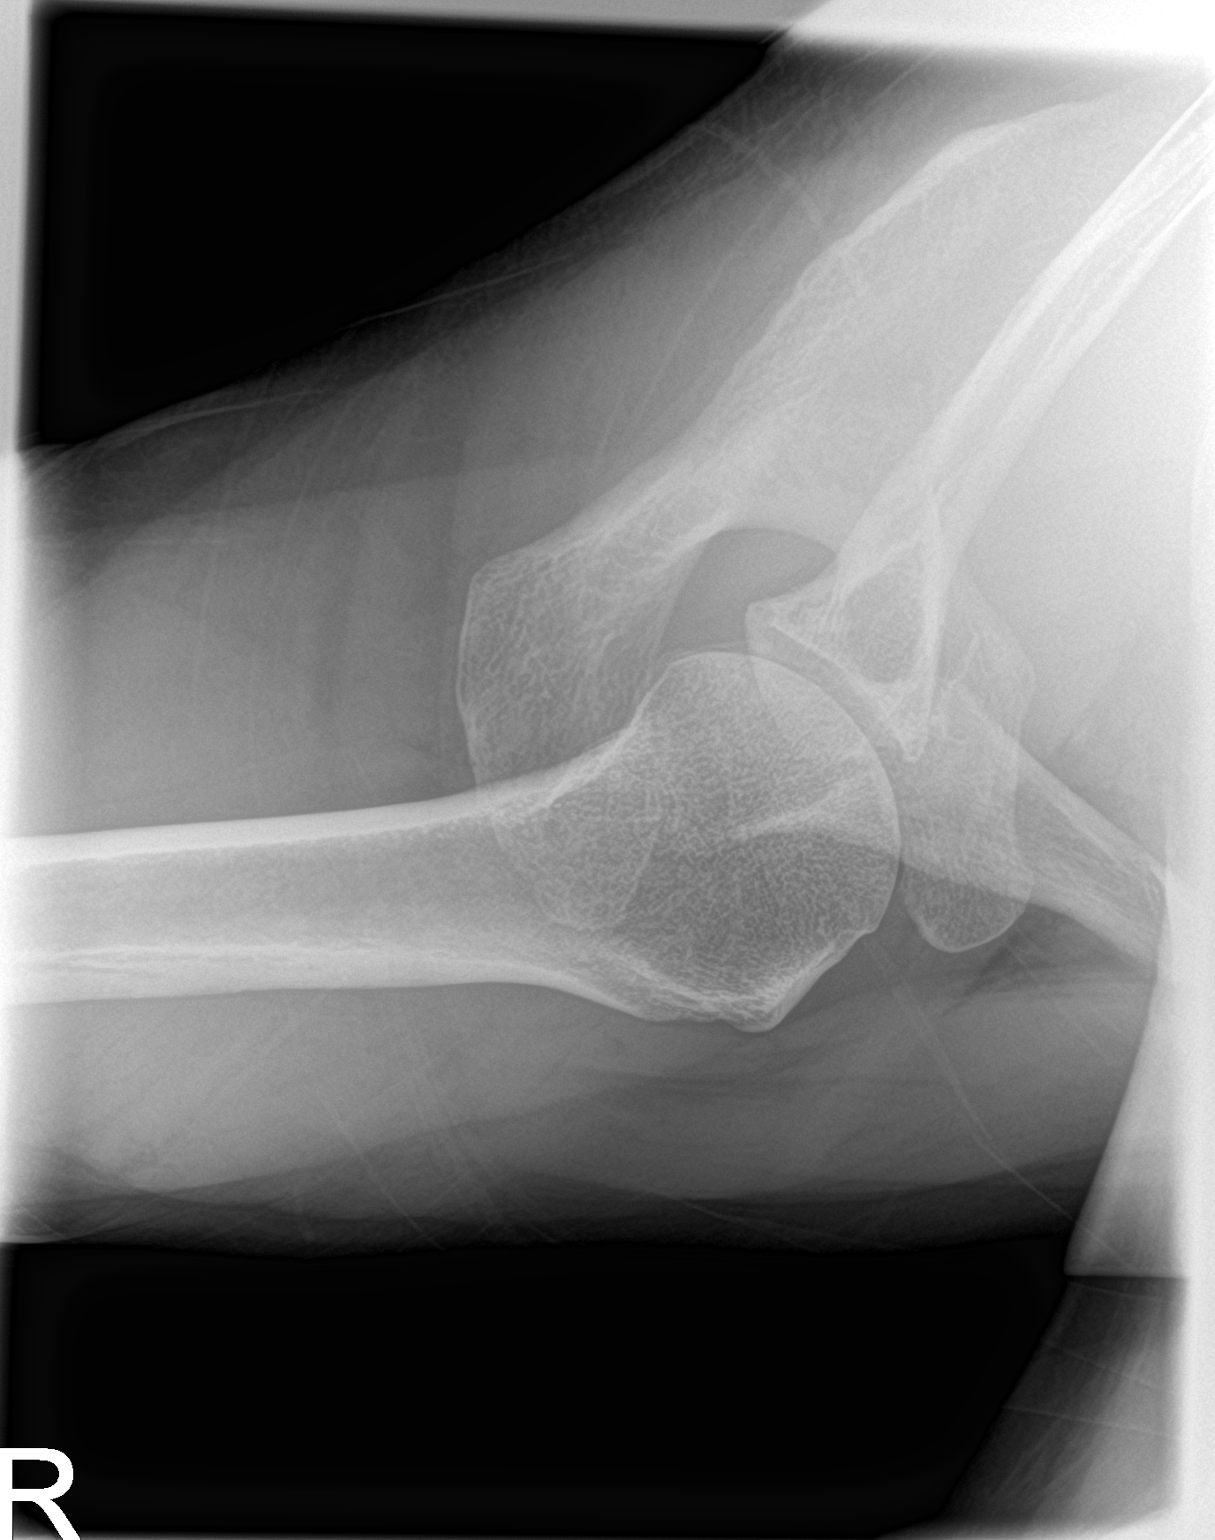

[3 of 3 positions shown; findings below may reference images not displayed]

FINDINGS: Acromioclavicular and glenohumeral degenerative change. No acute
abnormality identified. No evidence of fracture dislocation.
IMPRESSION: Degenerative changes right shoulder.No acute abnormality.

## 2016-07-01 DIAGNOSIS — L578 Other skin changes due to chronic exposure to nonionizing radiation: Secondary | ICD-10-CM | POA: Diagnosis not present

## 2016-07-01 DIAGNOSIS — D224 Melanocytic nevi of scalp and neck: Secondary | ICD-10-CM | POA: Diagnosis not present

## 2016-07-01 DIAGNOSIS — L739 Follicular disorder, unspecified: Secondary | ICD-10-CM | POA: Diagnosis not present

## 2016-07-28 ENCOUNTER — Telehealth: Payer: Self-pay

## 2016-07-28 MED ORDER — NAPROXEN 500 MG PO TABS
500.0000 mg | ORAL_TABLET | Freq: Two times a day (BID) | ORAL | 1 refills | Status: DC
Start: 2016-07-28 — End: 2016-11-02

## 2016-07-28 NOTE — Telephone Encounter (Signed)
Ok to fill Rx Sch ROV Thx

## 2016-07-28 NOTE — Telephone Encounter (Signed)
Rx request for 90 day supply for Naproxen 500 mg  Last refilled  06/15/2016 Last OV 01/21/2016 No new OV  Please advise

## 2016-07-28 NOTE — Telephone Encounter (Signed)
Rx Naproxen 500 mg #90 [facility] to [facility]

## 2016-07-28 NOTE — Addendum Note (Signed)
Addended by: Valere Dross on: 07/28/2016 02:56 PM   Modules accepted: Orders

## 2016-08-18 ENCOUNTER — Ambulatory Visit (INDEPENDENT_AMBULATORY_CARE_PROVIDER_SITE_OTHER)
Admission: RE | Admit: 2016-08-18 | Discharge: 2016-08-18 | Disposition: A | Discharge status: Home / Self Care | Payer: BLUE CROSS/BLUE SHIELD | Source: Ambulatory Visit | Attending: Internal Medicine | Admitting: Internal Medicine

## 2016-08-18 ENCOUNTER — Encounter: Payer: Self-pay | Admitting: Internal Medicine

## 2016-08-18 ENCOUNTER — Other Ambulatory Visit (INDEPENDENT_AMBULATORY_CARE_PROVIDER_SITE_OTHER): Payer: BLUE CROSS/BLUE SHIELD

## 2016-08-18 ENCOUNTER — Ambulatory Visit (INDEPENDENT_AMBULATORY_CARE_PROVIDER_SITE_OTHER): Payer: BLUE CROSS/BLUE SHIELD | Admitting: Internal Medicine

## 2016-08-18 DIAGNOSIS — R232 Flushing: Secondary | ICD-10-CM

## 2016-08-18 DIAGNOSIS — M75101 Unspecified rotator cuff tear or rupture of right shoulder, not specified as traumatic: Secondary | ICD-10-CM

## 2016-08-18 DIAGNOSIS — R61 Generalized hyperhidrosis: Secondary | ICD-10-CM | POA: Diagnosis not present

## 2016-08-18 LAB — BASIC METABOLIC PANEL
BUN: 20 mg/dL (ref 6–23)
CHLORIDE: 104 meq/L (ref 96–112)
CO2: 29 meq/L (ref 19–32)
CREATININE: 1.01 mg/dL (ref 0.40–1.50)
Calcium: 9.8 mg/dL (ref 8.4–10.5)
GFR: 83.43 mL/min (ref >60.00)
Glucose, Bld: 100 mg/dL — ABNORMAL HIGH (ref 70–99)
POTASSIUM: 4.2 meq/L (ref 3.5–5.1)
SODIUM: 139 meq/L (ref 135–145)

## 2016-08-18 LAB — VITAMIN B12: VITAMIN B 12: 664 pg/mL (ref 211–911)

## 2016-08-18 LAB — SEDIMENTATION RATE: Sed Rate: 1 mm/hr (ref 0–15)

## 2016-08-18 LAB — HEPATIC FUNCTION PANEL
ALT: 26 U/L (ref 0–53)
AST: 20 U/L (ref 0–37)
Albumin: 4.7 g/dL (ref 3.5–5.2)
Alkaline Phosphatase: 64 U/L (ref 39–117)
BILIRUBIN DIRECT: 0.1 mg/dL (ref 0.0–0.3)
TOTAL PROTEIN: 6.9 g/dL (ref 6.0–8.3)
Total Bilirubin: 0.5 mg/dL (ref 0.2–1.2)

## 2016-08-18 LAB — CBC WITH DIFFERENTIAL/PLATELET
BASOS PCT: 0.6 % (ref 0.0–3.0)
Basophils Absolute: 0 10*3/uL (ref 0.0–0.1)
EOS PCT: 2.5 % (ref 0.0–5.0)
Eosinophils Absolute: 0.2 10*3/uL (ref 0.0–0.7)
HEMATOCRIT: 47.3 % (ref 39.0–52.0)
HEMOGLOBIN: 16 g/dL (ref 13.0–17.0)
LYMPHS PCT: 28 % (ref 12.0–46.0)
Lymphs Abs: 1.8 10*3/uL (ref 0.7–4.0)
MCHC: 33.9 g/dL (ref 30.0–36.0)
MCV: 88.2 fl (ref 78.0–100.0)
MONOS PCT: 7.1 % (ref 3.0–12.0)
Monocytes Absolute: 0.5 10*3/uL (ref 0.1–1.0)
NEUTROS ABS: 4.1 10*3/uL (ref 1.4–7.7)
Neutrophils Relative %: 61.8 % (ref 43.0–77.0)
Platelets: 238 10*3/uL (ref 150.0–400.0)
RBC: 5.36 Mil/uL (ref 4.22–5.81)
RDW: 13.6 % (ref 11.5–15.5)
WBC: 6.6 10*3/uL (ref 4.0–10.5)

## 2016-08-18 LAB — TSH: TSH: 1.25 u[IU]/mL (ref 0.35–4.50)

## 2016-08-18 LAB — URINALYSIS
Bilirubin Urine: NEGATIVE
HGB URINE DIPSTICK: NEGATIVE
Ketones, ur: NEGATIVE
LEUKOCYTES UA: NEGATIVE
Nitrite: NEGATIVE
SPECIFIC GRAVITY, URINE: 1.015 (ref 1.000–1.030)
Total Protein, Urine: NEGATIVE
URINE GLUCOSE: NEGATIVE
UROBILINOGEN UA: 0.2 (ref 0.0–1.0)
pH: 7.5 (ref 5.0–8.0)

## 2016-08-18 LAB — VITAMIN D 25 HYDROXY (VIT D DEFICIENCY, FRACTURES): VITD: 30.48 ng/mL (ref 30.00–100.00)

## 2016-08-18 LAB — TESTOSTERONE: Testosterone: 311.99 ng/dL (ref 300.00–890.00)

## 2016-08-18 LAB — CORTISOL: CORTISOL PLASMA: 9.3 ug/dL

## 2016-08-18 MED ORDER — HYDROCODONE-ACETAMINOPHEN 5-325 MG PO TABS: 1.0000 | ORAL_TABLET | Freq: Two times a day (BID) | ORAL | 0 refills | Status: DC | PRN

## 2016-08-18 MED ORDER — TIZANIDINE HCL 4 MG PO TABS: ORAL_TABLET | ORAL | 3 refills | Status: DC

## 2016-08-18 NOTE — Assessment & Plan Note (Signed)
CXR Labs 

## 2016-08-18 NOTE — Progress Notes (Signed)
Subjective:  Patient ID: Zachary George, male    DOB: [birth year]  Age: 51 y.o. MRN: [id number]  CC: No chief complaint on file.   HPI Zachary George presents for sweats, hot flashes x 2 weeks. He had a cold like illness 3 wks ago. C/o fatigue F/u chronic shoulder pain  Outpatient Medications Prior to Visit  Medication Sig Dispense Refill  . dorzolamide (TRUSOPT) 2 % ophthalmic solution Place 1 drop into both eyes 2 (two) times daily.    . famotidine (PEPCID) 20 MG tablet One at bedtime 90 tablet 0  . HYDROcodone-acetaminophen (NORCO/VICODIN) 5-325 MG tablet Take 1-2 tablets by mouth 2 (two) times daily as needed for moderate pain. 60 tablet 0  . ibuprofen (ADVIL,MOTRIN) 600 MG tablet Take 1 tablet (600 mg total) by mouth every 8 (eight) hours as needed for moderate pain. 90 tablet 2  . naproxen (NAPROSYN) 500 MG tablet Take 1 tablet (500 mg total) by mouth 2 (two) times daily with a meal. 90 tablet 1  . pantoprazole (PROTONIX) 40 MG tablet Take 1 tablet (40 mg total) by mouth daily. Take 30-60 min before first meal of the day    . tiZANidine (ZANAFLEX) 4 MG tablet TAKE 1 TABLET BY MOUTH EVERY 6 HOURS AS NEEDED FOR MUSCLE SPASMS. 60 tablet 11  . pseudoephedrine (SUDAFED) 120 MG 12 hr tablet Take 1 tablet (120 mg total) by mouth 2 (two) times daily as needed for congestion. (Patient not taking: Reported on 08/18/2016) 20 tablet 0  . TRAVATAN Z 0.004 % SOLN ophthalmic solution Place 1 drop into both eyes at bedtime.  6  . fluticasone (FLONASE) 50 MCG/ACT nasal spray Place 2 sprays into both nostrils daily. (Patient taking differently: Place 2 sprays into both nostrils daily as needed. ) 16 g 0  . predniSONE (DELTASONE) 10 MG tablet Take  4 each am x 2 days,   2 each am x 2 days,  1 each am x 2 days and stop 14 tablet 0   No facility-administered medications prior to visit.     ROS Review of Systems  Constitutional: Positive for diaphoresis and fatigue. Negative for appetite change and  unexpected weight change.  HENT: Negative for congestion, nosebleeds, sneezing, sore throat and trouble swallowing.   Eyes: Negative for itching and visual disturbance.  Respiratory: Negative for cough.   Cardiovascular: Negative for chest pain, palpitations and leg swelling.  Gastrointestinal: Negative for abdominal distention, blood in stool, diarrhea and nausea.  Genitourinary: Negative for frequency and hematuria.  Musculoskeletal: Negative for back pain, gait problem, joint swelling and neck pain.  Skin: Negative for rash.  Neurological: Negative for dizziness, tremors, speech difficulty and weakness.  Psychiatric/Behavioral: Negative for agitation, dysphoric mood and sleep disturbance. The patient is not nervous/anxious.     Objective:  BP 122/76 (BP Location: Left Arm, Patient Position: Sitting, Cuff Size: Large)   Pulse 66   Temp 98.7 F (37.1 C) (Oral)   Ht 5\' 9"  (1.753 m)   Wt 186 lb 1.3 oz (84.4 kg)   SpO2 99%   BMI 27.48 kg/m   BP Readings from Last 3 Encounters:  08/18/16 122/76  02/06/16 114/76  01/21/16 130/90    Wt Readings from Last 3 Encounters:  08/18/16 186 lb 1.3 oz (84.4 kg)  02/06/16 172 lb (78 kg)  01/21/16 169 lb (76.7 kg)    Physical Exam  Constitutional: He is oriented to person, place, and time. He appears well-developed. No distress.  NAD  HENT:  Mouth/Throat: Oropharynx is clear and moist.  Eyes: Conjunctivae are normal. Pupils are equal, round, and reactive to light.  Neck: Normal range of motion. No JVD present. No thyromegaly present.  Cardiovascular: Normal rate, regular rhythm, normal heart sounds and intact distal pulses.  Exam reveals no gallop and no friction rub.   No murmur heard. Pulmonary/Chest: Effort normal and breath sounds normal. No respiratory distress. He has no wheezes. He has no rales. He exhibits no tenderness.  Abdominal: Soft. Bowel sounds are normal. He exhibits no distension and no mass. There is no tenderness.  There is no rebound and no guarding.  Musculoskeletal: Normal range of motion. He exhibits no edema or tenderness.  Lymphadenopathy:    He has no cervical adenopathy.  Neurological: He is alert and oriented to person, place, and time. He has normal reflexes. No cranial nerve deficit. He exhibits normal muscle tone. He displays a negative Romberg sign. Coordination and gait normal.  Skin: Skin is warm and dry. No rash noted.  Psychiatric: He has a normal mood and affect. His behavior is normal. Judgment and thought content normal.    Lab Results  Component Value Date   WBC 7.0 01/21/2016   HGB 14.8 01/21/2016   HCT 42.8 01/21/2016   PLT 223.0 01/21/2016   GLUCOSE 86 01/21/2016   CHOL 217 (H) 05/22/2013   TRIG 48.0 05/22/2013   HDL 60.10 05/22/2013   LDLDIRECT 156.0 05/22/2013   LDLCALC 128 (H) 09/01/2011   ALT 16 01/21/2016   AST 14 01/21/2016   NA 141 01/21/2016   K 3.4 (L) 01/21/2016   CL 107 01/21/2016   CREATININE 0.92 01/21/2016   BUN 18 01/21/2016   CO2 29 01/21/2016   TSH 2.02 01/21/2016   PSA 1.45 09/01/2011    Dg Chest 2 View  Result Date: 01/21/2016 CLINICAL DATA:  Cough for several weeks and left-sided chest pain, initial encounter EXAM: CHEST  2 VIEW COMPARISON:  11/06/2015 FINDINGS: Cardiac shadow is within normal limits. The lungs are well aerated bilaterally. No focal infiltrate or sizable effusion is seen. No bony abnormality is noted. IMPRESSION: No active cardiopulmonary disease. Electronically Signed   By: Inez Catalina M.D.   On: 01/21/2016 09:54    Assessment & Plan:   There are no diagnoses linked to this encounter. I have discontinued Mr. Sistrunk fluticasone and predniSONE. I am also having him maintain his pseudoephedrine, tiZANidine, TRAVATAN Z, ibuprofen, dorzolamide, pantoprazole, famotidine, HYDROcodone-acetaminophen, naproxen, and latanoprost.  Meds ordered this encounter  Medications  . latanoprost (XALATAN) 0.005 % ophthalmic solution      Follow-up: No Follow-up on file.  Walker Kehr, MD

## 2016-08-18 NOTE — Progress Notes (Signed)
Pre visit review using our clinic review tool, if applicable. No additional management support is needed unless otherwise documented below in the visit note. 

## 2016-08-18 NOTE — Assessment & Plan Note (Signed)
Norco prn - rare  Potential benefits of a long term opioids use as well as potential risks (i.e. addiction risk, apnea etc) and complications (i.e. Somnolence, constipation and others) were explained to the patient and were aknowledged. 

## 2016-08-19 LAB — T4, FREE: Free T4: 0.79 ng/dL (ref 0.60–1.60)

## 2016-09-01 ENCOUNTER — Ambulatory Visit (INDEPENDENT_AMBULATORY_CARE_PROVIDER_SITE_OTHER): Payer: BLUE CROSS/BLUE SHIELD | Admitting: General Practice

## 2016-09-01 DIAGNOSIS — Z23 Encounter for immunization: Secondary | ICD-10-CM

## 2016-10-12 ENCOUNTER — Other Ambulatory Visit: Payer: Self-pay | Admitting: Internal Medicine

## 2016-11-02 ENCOUNTER — Other Ambulatory Visit: Payer: Self-pay | Admitting: Internal Medicine

## 2016-11-06 ENCOUNTER — Encounter: Payer: Self-pay | Admitting: Internal Medicine

## 2016-11-06 ENCOUNTER — Ambulatory Visit (INDEPENDENT_AMBULATORY_CARE_PROVIDER_SITE_OTHER): Payer: BLUE CROSS/BLUE SHIELD | Admitting: Internal Medicine

## 2016-11-06 DIAGNOSIS — R05 Cough: Secondary | ICD-10-CM | POA: Diagnosis not present

## 2016-11-06 DIAGNOSIS — R059 Cough, unspecified: Secondary | ICD-10-CM

## 2016-11-06 DIAGNOSIS — K219 Gastro-esophageal reflux disease without esophagitis: Secondary | ICD-10-CM

## 2016-11-06 DIAGNOSIS — R058 Other specified cough: Secondary | ICD-10-CM

## 2016-11-06 MED ORDER — HYDROCODONE-ACETAMINOPHEN 5-325 MG PO TABS: 1.0000 | ORAL_TABLET | Freq: Two times a day (BID) | ORAL | 0 refills | Status: DC | PRN

## 2016-11-06 MED ORDER — PANTOPRAZOLE SODIUM 40 MG PO TBEC: 40.0000 mg | DELAYED_RELEASE_TABLET | Freq: Every day | ORAL | 5 refills | Status: DC

## 2016-11-06 NOTE — Patient Instructions (Signed)

## 2016-11-06 NOTE — Assessment & Plan Note (Signed)
Re-start Protonix 

## 2016-11-06 NOTE — Assessment & Plan Note (Signed)
Poss GERD related

## 2016-11-06 NOTE — Progress Notes (Signed)
Subjective:  Patient ID: Zachary George, male    DOB: [birth year]  Age: 51 y.o. MRN: [id number]  CC: No chief complaint on file.   HPI Rashawn Knape presents for cough after eating (5 min or so) x 2 weeks. No GERD sx's. C/o low testosterone F/u LBP   Outpatient Medications Prior to Visit  Medication Sig Dispense Refill  . dorzolamide (TRUSOPT) 2 % ophthalmic solution Place 1 drop into both eyes 2 (two) times daily.    . famotidine (PEPCID) 20 MG tablet ONE AT BEDTIME 90 tablet 0  . HYDROcodone-acetaminophen (NORCO/VICODIN) 5-325 MG tablet Take 1 tablet by mouth 2 (two) times daily as needed for moderate pain. 60 tablet 0  . ibuprofen (ADVIL,MOTRIN) 600 MG tablet Take 1 tablet (600 mg total) by mouth every 8 (eight) hours as needed for moderate pain. 90 tablet 2  . latanoprost (XALATAN) 0.005 % ophthalmic solution     . naproxen (NAPROSYN) 500 MG tablet TAKE 1 TABLET (500 MG TOTAL) BY MOUTH 2 (TWO) TIMES DAILY WITH A MEAL. 90 tablet 1  . pantoprazole (PROTONIX) 40 MG tablet Take 1 tablet (40 mg total) by mouth daily. Take 30-60 min before first meal of the day    . pseudoephedrine (SUDAFED) 120 MG 12 hr tablet Take 1 tablet (120 mg total) by mouth 2 (two) times daily as needed for congestion. 20 tablet 0  . tiZANidine (ZANAFLEX) 4 MG tablet TAKE 1 TABLET BY MOUTH EVERY 6 HOURS AS NEEDED FOR MUSCLE SPASMS. 60 tablet 3  . TRAVATAN Z 0.004 % SOLN ophthalmic solution Place 1 drop into both eyes at bedtime.  6   No facility-administered medications prior to visit.     ROS Review of Systems  Constitutional: Negative for appetite change, fatigue and unexpected weight change.  HENT: Negative for congestion, nosebleeds, sneezing, sore throat and trouble swallowing.   Eyes: Negative for itching and visual disturbance.  Respiratory: Positive for cough.   Cardiovascular: Negative for chest pain, palpitations and leg swelling.  Gastrointestinal: Negative for abdominal distention, blood in stool,  diarrhea and nausea.  Genitourinary: Negative for frequency and hematuria.  Musculoskeletal: Positive for back pain. Negative for gait problem, joint swelling and neck pain.  Skin: Negative for rash.  Neurological: Negative for dizziness, tremors, speech difficulty and weakness.  Psychiatric/Behavioral: Negative for agitation, dysphoric mood and sleep disturbance. The patient is not nervous/anxious.     Objective:  BP 122/74 (BP Location: Left Arm, Patient Position: Sitting, Cuff Size: Large)   Pulse 61   Temp 98.1 F (36.7 C) (Oral)   Ht 5\' 9"  (1.753 m)   Wt 184 lb (83.5 kg)   SpO2 99%   BMI 27.17 kg/m   BP Readings from Last 3 Encounters:  11/06/16 122/74  08/18/16 122/76  02/06/16 114/76    Wt Readings from Last 3 Encounters:  11/06/16 184 lb (83.5 kg)  08/18/16 186 lb 1.3 oz (84.4 kg)  02/06/16 172 lb (78 kg)    Physical Exam  Constitutional: He is oriented to person, place, and time. He appears well-developed. No distress.  NAD  HENT:  Mouth/Throat: Oropharynx is clear and moist.  Eyes: Conjunctivae are normal. Pupils are equal, round, and reactive to light.  Neck: Normal range of motion. No JVD present. No thyromegaly present.  Cardiovascular: Normal rate, regular rhythm, normal heart sounds and intact distal pulses.  Exam reveals no gallop and no friction rub.   No murmur heard. Pulmonary/Chest: Effort normal and breath sounds normal. No respiratory  distress. He has no wheezes. He has no rales. He exhibits no tenderness.  Abdominal: Soft. Bowel sounds are normal. He exhibits no distension and no mass. There is no tenderness. There is no rebound and no guarding.  Musculoskeletal: Normal range of motion. He exhibits no edema or tenderness.  Lymphadenopathy:    He has no cervical adenopathy.  Neurological: He is alert and oriented to person, place, and time. He has normal reflexes. No cranial nerve deficit. He exhibits normal muscle tone. He displays a negative  Romberg sign. Coordination and gait normal.  Skin: Skin is warm and dry. No rash noted.  Psychiatric: He has a normal mood and affect. His behavior is normal. Judgment and thought content normal.    Lab Results  Component Value Date   WBC 6.6 08/18/2016   HGB 16.0 08/18/2016   HCT 47.3 08/18/2016   PLT 238.0 08/18/2016   GLUCOSE 100 (H) 08/18/2016   CHOL 217 (H) 05/22/2013   TRIG 48.0 05/22/2013   HDL 60.10 05/22/2013   LDLDIRECT 156.0 05/22/2013   LDLCALC 128 (H) 09/01/2011   ALT 26 08/18/2016   AST 20 08/18/2016   NA 139 08/18/2016   K 4.2 08/18/2016   CL 104 08/18/2016   CREATININE 1.01 08/18/2016   BUN 20 08/18/2016   CO2 29 08/18/2016   TSH 1.25 08/18/2016   PSA 1.45 09/01/2011    Dg Chest 2 View  Result Date: 08/18/2016 CLINICAL DATA:  Severe sweats since the patient had a cold 3 weeks ago. EXAM: CHEST  2 VIEW COMPARISON:  PA and lateral chest 01/21/2016 and 05/01/2013. FINDINGS: The lungs are clear. Heart size is normal. No pneumothorax or pleural effusion. No acute bony abnormality. IMPRESSION: Negative chest. Electronically Signed   By: Inge Rise M.D.   On: 08/18/2016 15:01    Assessment & Plan:   There are no diagnoses linked to this encounter. I am having Mr. Leazer maintain his pseudoephedrine, TRAVATAN Z, ibuprofen, dorzolamide, pantoprazole, latanoprost, tiZANidine, HYDROcodone-acetaminophen, famotidine, and naproxen.  No orders of the defined types were placed in this encounter.    Follow-up: No Follow-up on file.  Walker Kehr, MD

## 2016-11-06 NOTE — Assessment & Plan Note (Signed)
Start Protonix 

## 2017-01-20 DIAGNOSIS — S6991XA Unspecified injury of right wrist, hand and finger(s), initial encounter: Secondary | ICD-10-CM | POA: Diagnosis not present

## 2017-01-25 ENCOUNTER — Other Ambulatory Visit: Payer: Self-pay | Admitting: Internal Medicine

## 2017-02-11 ENCOUNTER — Other Ambulatory Visit: Payer: Self-pay | Admitting: Internal Medicine

## 2017-03-09 DIAGNOSIS — M79644 Pain in right finger(s): Secondary | ICD-10-CM | POA: Diagnosis not present

## 2017-03-09 DIAGNOSIS — S5321XA Traumatic rupture of right radial collateral ligament, initial encounter: Secondary | ICD-10-CM | POA: Diagnosis not present

## 2017-03-11 ENCOUNTER — Other Ambulatory Visit: Payer: Self-pay | Admitting: Orthopedic Surgery

## 2017-03-11 DIAGNOSIS — S5321XA Traumatic rupture of right radial collateral ligament, initial encounter: Secondary | ICD-10-CM

## 2017-03-11 DIAGNOSIS — S63641A Sprain of metacarpophalangeal joint of right thumb, initial encounter: Secondary | ICD-10-CM

## 2017-03-30 ENCOUNTER — Ambulatory Visit
Admission: RE | Admit: 2017-03-30 | Discharge: 2017-03-30 | Disposition: A | Discharge status: Home / Self Care | Payer: BLUE CROSS/BLUE SHIELD | Source: Ambulatory Visit | Attending: Orthopedic Surgery | Admitting: Orthopedic Surgery

## 2017-03-30 DIAGNOSIS — S63641A Sprain of metacarpophalangeal joint of right thumb, initial encounter: Secondary | ICD-10-CM

## 2017-03-30 DIAGNOSIS — S5321XA Traumatic rupture of right radial collateral ligament, initial encounter: Secondary | ICD-10-CM

## 2017-03-30 MED ORDER — IOPAMIDOL (ISOVUE-M 200) INJECTION 41%
1.5000 mL | Freq: Once | INTRAMUSCULAR | Status: AC
  Administered 2017-03-30: 1.5 mL via INTRA_ARTICULAR

## 2017-03-31 DIAGNOSIS — S5321XA Traumatic rupture of right radial collateral ligament, initial encounter: Secondary | ICD-10-CM | POA: Diagnosis not present

## 2017-03-31 DIAGNOSIS — S63641D Sprain of metacarpophalangeal joint of right thumb, subsequent encounter: Secondary | ICD-10-CM | POA: Diagnosis not present

## 2017-04-05 ENCOUNTER — Encounter: Payer: Self-pay | Admitting: Internal Medicine

## 2017-04-05 ENCOUNTER — Ambulatory Visit (INDEPENDENT_AMBULATORY_CARE_PROVIDER_SITE_OTHER): Payer: BLUE CROSS/BLUE SHIELD | Admitting: Internal Medicine

## 2017-04-05 VITALS — BP 120/86 | HR 64 | Temp 98.0°F | Ht 69.0 in | Wt 190.0 lb

## 2017-04-05 DIAGNOSIS — R059 Cough, unspecified: Secondary | ICD-10-CM

## 2017-04-05 DIAGNOSIS — R062 Wheezing: Secondary | ICD-10-CM | POA: Diagnosis not present

## 2017-04-05 DIAGNOSIS — J309 Allergic rhinitis, unspecified: Secondary | ICD-10-CM

## 2017-04-05 DIAGNOSIS — R05 Cough: Secondary | ICD-10-CM

## 2017-04-05 MED ORDER — FLUTICASONE FUROATE-VILANTEROL 200-25 MCG/INH IN AEPB: 1.0000 | INHALATION_SPRAY | Freq: Every day | RESPIRATORY_TRACT | 0 refills | Status: DC

## 2017-04-05 MED ORDER — LEVOFLOXACIN 500 MG PO TABS: 500.0000 mg | ORAL_TABLET | Freq: Every day | ORAL | 0 refills | Status: DC

## 2017-04-05 MED ORDER — PREDNISONE 10 MG PO TABS: ORAL_TABLET | ORAL | 0 refills | Status: DC

## 2017-04-05 MED ORDER — HYDROCODONE-HOMATROPINE 5-1.5 MG/5ML PO SYRP: 5.0000 mL | ORAL_SOLUTION | Freq: Four times a day (QID) | ORAL | 0 refills | Status: DC | PRN

## 2017-04-05 MED ORDER — HYDROCODONE-ACETAMINOPHEN 5-325 MG PO TABS: 1.0000 | ORAL_TABLET | Freq: Two times a day (BID) | ORAL | 0 refills | Status: DC | PRN

## 2017-04-05 NOTE — Progress Notes (Signed)
Subjective:    Patient ID: Zachary George, male    DOB: 1966/05/04, 51 y.o.   MRN: [id number]  HPI  Here with acute onset mild to mod 2-3 days ST, HA, general weakness and malaise, with prod cough greenish sputum, but Pt denies chest pain, increased sob or doe, wheezing, orthopnea, PND, increased LE swelling, palpitations, dizziness or syncope, except for mild sob and wheezing since last pm.  Wife ill recently and urged him to come in.   Pt denies polydipsia, polyuria.  Does have several wks ongoing nasal allergy symptoms with clearish congestion, itch and sneezing Past Medical History:  Diagnosis Date  . Allergic rhinitis, cause unspecified 09/05/2013  . IBS (irritable bowel syndrome)   . Left elbow pain 2011   tennis elbow   History reviewed. No pertinent surgical history.  reports that  has never smoked. he has never used smokeless tobacco. He reports that he drinks alcohol. He reports that he does not use drugs. family history includes Hypertension in his father. No Known Allergies Current Outpatient Medications on File Prior to Visit  Medication Sig Dispense Refill  . albuterol (PROVENTIL HFA;VENTOLIN HFA) 108 (90 Base) MCG/ACT inhaler Inhale 2 puffs into the lungs every 6 (six) hours as needed for wheezing or shortness of breath. 1 Inhaler 2  . dorzolamide (TRUSOPT) 2 % ophthalmic solution Place 1 drop into both eyes 2 (two) times daily.    . famotidine (PEPCID) 20 MG tablet ONE AT BEDTIME 90 tablet 0  . ibuprofen (ADVIL,MOTRIN) 600 MG tablet Take 1 tablet (600 mg total) by mouth every 8 (eight) hours as needed for moderate pain. 90 tablet 2  . latanoprost (XALATAN) 0.005 % ophthalmic solution     . naproxen (NAPROSYN) 500 MG tablet TAKE 1 TABLET (500 MG TOTAL) BY MOUTH 2 (TWO) TIMES DAILY WITH A MEAL. 90 tablet 1  . pantoprazole (PROTONIX) 40 MG tablet Take 1 tablet (40 mg total) by mouth daily. Take 30-60 min before first meal of the day 30 tablet 5  . pseudoephedrine (SUDAFED) 120 MG  12 hr tablet Take 1 tablet (120 mg total) by mouth 2 (two) times daily as needed for congestion. 20 tablet 0  . tiZANidine (ZANAFLEX) 4 MG tablet TAKE 1 TABLET BY MOUTH EVERY 6 HOURS AS NEEDED FOR MUSCLE SPASMS. 60 tablet 3  . TRAVATAN Z 0.004 % SOLN ophthalmic solution Place 1 drop into both eyes at bedtime.  6   No current facility-administered medications on file prior to visit.    Review of Systems  Constitutional: Negative for other unusual diaphoresis or sweats HENT: Negative for ear discharge or swelling Eyes: Negative for other worsening visual disturbances Respiratory: Negative for stridor or other swelling  Gastrointestinal: Negative for worsening distension or other blood Genitourinary: Negative for retention or other urinary change Musculoskeletal: Negative for other MSK pain or swelling Skin: Negative for color change or other new lesions Neurological: Negative for worsening tremors and other numbness  Psychiatric/Behavioral: Negative for worsening agitation or other fatigue All other system neg per pt    Objective:   Physical Exam BP 120/86   Pulse 64   Temp 98 F (36.7 C) (Oral)   Ht 5\' 9"  (1.753 m)   Wt 190 lb (86.2 kg)   SpO2 100%   BMI 28.06 kg/m  VS noted, mild ill Constitutional: Pt appears in NAD HENT: Head: NCAT.  Right Ear: External ear normal.  Left Ear: External ear normal.  Eyes: . Pupils are equal, round, and  reactive to light. Conjunctivae and EOM are normal Bilat tm's with mild erythema.  Max sinus areas non tender.  Pharynx with mild erythema, no exudate Nose: without d/c or deformity Neck: Neck supple. Gross normal ROM Cardiovascular: Normal rate and regular rhythm.   Pulmonary/Chest: Effort normal and breath sounds reduced without rales but with mild bilat wheezing.  Neurological: Pt is alert. At baseline orientation, motor grossly intact Skin: Skin is warm. No rashes, other new lesions, no LE edema Psychiatric: Pt behavior is normal without  agitation  No other exam findings    Assessment & Plan:

## 2017-04-05 NOTE — Patient Instructions (Addendum)
Please take all new medication as prescribed  - the antibiotic, cough medicine, prednisone and Breo inhaler until better  Please continue all other medications as before, and refills have been done if requested.  Please have the pharmacy call with any other refills you may need.  Please keep your appointments with your specialists as you may have planned

## 2017-04-08 NOTE — Assessment & Plan Note (Signed)
Mild, likely bronchospastic in nature, for tx as above and predpac and breo asd

## 2017-04-08 NOTE — Assessment & Plan Note (Signed)
Mild, likely to improve with steroid tx, also for otc allegra and/or nasacort asd,  to f/u any worsening symptoms or concerns

## 2017-04-08 NOTE — Assessment & Plan Note (Signed)
Mild to mod, c/ bronchitis vs pna, for antibx course, cough med prn, for cxr, to f/u any worsening symptoms or concerns

## 2017-04-09 ENCOUNTER — Other Ambulatory Visit: Payer: Self-pay | Admitting: Orthopedic Surgery

## 2017-04-15 ENCOUNTER — Other Ambulatory Visit: Payer: Self-pay

## 2017-04-15 ENCOUNTER — Encounter (HOSPITAL_BASED_OUTPATIENT_CLINIC_OR_DEPARTMENT_OTHER): Payer: Self-pay | Admitting: *Deleted

## 2017-04-22 ENCOUNTER — Other Ambulatory Visit: Payer: Self-pay

## 2017-04-22 ENCOUNTER — Ambulatory Visit (HOSPITAL_BASED_OUTPATIENT_CLINIC_OR_DEPARTMENT_OTHER): Payer: BLUE CROSS/BLUE SHIELD | Admitting: Anesthesiology

## 2017-04-22 ENCOUNTER — Encounter (HOSPITAL_BASED_OUTPATIENT_CLINIC_OR_DEPARTMENT_OTHER): Payer: Self-pay | Admitting: *Deleted

## 2017-04-22 ENCOUNTER — Ambulatory Visit (HOSPITAL_BASED_OUTPATIENT_CLINIC_OR_DEPARTMENT_OTHER)
Admission: RE | Admit: 2017-04-22 | Discharge: 2017-04-22 | Disposition: A | Discharge status: Home / Self Care | Payer: BLUE CROSS/BLUE SHIELD | Source: Ambulatory Visit | Attending: Orthopedic Surgery | Admitting: Orthopedic Surgery

## 2017-04-22 ENCOUNTER — Encounter (HOSPITAL_BASED_OUTPATIENT_CLINIC_OR_DEPARTMENT_OTHER)
Admission: RE | Disposition: A | Discharge status: Home / Self Care | Payer: Self-pay | Source: Ambulatory Visit | Attending: Orthopedic Surgery

## 2017-04-22 DIAGNOSIS — X58XXXA Exposure to other specified factors, initial encounter: Secondary | ICD-10-CM | POA: Diagnosis not present

## 2017-04-22 DIAGNOSIS — K589 Irritable bowel syndrome without diarrhea: Secondary | ICD-10-CM | POA: Diagnosis not present

## 2017-04-22 DIAGNOSIS — Z79899 Other long term (current) drug therapy: Secondary | ICD-10-CM | POA: Diagnosis not present

## 2017-04-22 DIAGNOSIS — S5321XA Traumatic rupture of right radial collateral ligament, initial encounter: Secondary | ICD-10-CM | POA: Diagnosis not present

## 2017-04-22 DIAGNOSIS — Y929 Unspecified place or not applicable: Secondary | ICD-10-CM | POA: Diagnosis not present

## 2017-04-22 DIAGNOSIS — S63641A Sprain of metacarpophalangeal joint of right thumb, initial encounter: Secondary | ICD-10-CM | POA: Insufficient documentation

## 2017-04-22 DIAGNOSIS — M7712 Lateral epicondylitis, left elbow: Secondary | ICD-10-CM | POA: Diagnosis not present

## 2017-04-22 DIAGNOSIS — S5331XA Traumatic rupture of right ulnar collateral ligament, initial encounter: Secondary | ICD-10-CM | POA: Diagnosis not present

## 2017-04-22 HISTORY — PX: ULNAR COLLATERAL LIGAMENT REPAIR: SHX6159

## 2017-04-22 HISTORY — PX: LIGAMENT REPAIR: SHX5444

## 2017-04-22 SURGERY — REPAIR, LIGAMENT
Anesthesia: General | Site: Thumb | Laterality: Right

## 2017-04-22 MED ORDER — MIDAZOLAM HCL 2 MG/2ML IJ SOLN
1.0000 mg | INTRAMUSCULAR | Status: DC | PRN
  Administered 2017-04-22: 2 mg via INTRAVENOUS

## 2017-04-22 MED ORDER — OXYCODONE HCL 5 MG/5ML PO SOLN: 5.0000 mg | Freq: Once | ORAL | Status: AC | PRN

## 2017-04-22 MED ORDER — LIDOCAINE 2% (20 MG/ML) 5 ML SYRINGE
INTRAMUSCULAR | Status: AC
  Filled 2017-04-22: qty 5

## 2017-04-22 MED ORDER — ONDANSETRON HCL 4 MG/2ML IJ SOLN
INTRAMUSCULAR | Status: AC
  Filled 2017-04-22: qty 2

## 2017-04-22 MED ORDER — DEXAMETHASONE SODIUM PHOSPHATE 10 MG/ML IJ SOLN
INTRAMUSCULAR | Status: AC
  Filled 2017-04-22: qty 1

## 2017-04-22 MED ORDER — MIDAZOLAM HCL 2 MG/2ML IJ SOLN
INTRAMUSCULAR | Status: AC
  Filled 2017-04-22: qty 2

## 2017-04-22 MED ORDER — HYDROCODONE-ACETAMINOPHEN 5-325 MG PO TABS: ORAL_TABLET | ORAL | 0 refills | Status: DC

## 2017-04-22 MED ORDER — BUPIVACAINE HCL (PF) 0.25 % IJ SOLN
INTRAMUSCULAR | Status: DC | PRN
  Administered 2017-04-22: 7 mL

## 2017-04-22 MED ORDER — KETOROLAC TROMETHAMINE 30 MG/ML IJ SOLN: 30.0000 mg | Freq: Once | INTRAMUSCULAR | Status: DC | PRN

## 2017-04-22 MED ORDER — LACTATED RINGERS IV SOLN
INTRAVENOUS | Status: DC
  Administered 2017-04-22: 13:00:00 via INTRAVENOUS

## 2017-04-22 MED ORDER — ONDANSETRON HCL 4 MG/2ML IJ SOLN: 4.0000 mg | Freq: Once | INTRAMUSCULAR | Status: DC | PRN

## 2017-04-22 MED ORDER — CEFAZOLIN SODIUM-DEXTROSE 2-4 GM/100ML-% IV SOLN
INTRAVENOUS | Status: AC
  Filled 2017-04-22: qty 100

## 2017-04-22 MED ORDER — PROPOFOL 10 MG/ML IV BOLUS
INTRAVENOUS | Status: DC | PRN
  Administered 2017-04-22: 200 mg via INTRAVENOUS

## 2017-04-22 MED ORDER — ACETAMINOPHEN 325 MG PO TABS: 325.0000 mg | ORAL_TABLET | ORAL | Status: DC | PRN

## 2017-04-22 MED ORDER — FENTANYL CITRATE (PF) 100 MCG/2ML IJ SOLN
INTRAMUSCULAR | Status: AC
  Filled 2017-04-22: qty 2

## 2017-04-22 MED ORDER — CHLORHEXIDINE GLUCONATE 4 % EX LIQD: 60.0000 mL | Freq: Once | CUTANEOUS | Status: DC

## 2017-04-22 MED ORDER — OXYCODONE HCL 5 MG PO TABS
ORAL_TABLET | ORAL | Status: AC
  Filled 2017-04-22: qty 1

## 2017-04-22 MED ORDER — FENTANYL CITRATE (PF) 100 MCG/2ML IJ SOLN
50.0000 ug | INTRAMUSCULAR | Status: AC | PRN
  Administered 2017-04-22: 50 ug via INTRAVENOUS
  Administered 2017-04-22: 100 ug via INTRAVENOUS
  Administered 2017-04-22: 50 ug via INTRAVENOUS

## 2017-04-22 MED ORDER — SCOPOLAMINE 1 MG/3DAYS TD PT72
1.0000 | MEDICATED_PATCH | Freq: Once | TRANSDERMAL | Status: DC | PRN
  Administered 2017-04-22: 1.5 mg via TRANSDERMAL

## 2017-04-22 MED ORDER — KETOROLAC TROMETHAMINE 30 MG/ML IJ SOLN
INTRAMUSCULAR | Status: DC | PRN
  Administered 2017-04-22: 30 mg via INTRAVENOUS

## 2017-04-22 MED ORDER — MEPERIDINE HCL 25 MG/ML IJ SOLN: 6.2500 mg | INTRAMUSCULAR | Status: DC | PRN

## 2017-04-22 MED ORDER — SCOPOLAMINE 1 MG/3DAYS TD PT72
MEDICATED_PATCH | TRANSDERMAL | Status: AC
  Filled 2017-04-22: qty 1

## 2017-04-22 MED ORDER — LIDOCAINE 2% (20 MG/ML) 5 ML SYRINGE
INTRAMUSCULAR | Status: DC | PRN
Start: 2017-04-22 — End: 2017-04-22
  Administered 2017-04-22: 50 mg via INTRAVENOUS

## 2017-04-22 MED ORDER — OXYCODONE HCL 5 MG PO TABS
5.0000 mg | ORAL_TABLET | Freq: Once | ORAL | Status: AC | PRN
  Administered 2017-04-22: 5 mg via ORAL

## 2017-04-22 MED ORDER — ACETAMINOPHEN 160 MG/5ML PO SOLN: 325.0000 mg | ORAL | Status: DC | PRN

## 2017-04-22 MED ORDER — CEFAZOLIN SODIUM-DEXTROSE 2-4 GM/100ML-% IV SOLN
2.0000 g | INTRAVENOUS | Status: AC
  Administered 2017-04-22: 2 g via INTRAVENOUS

## 2017-04-22 MED ORDER — ONDANSETRON HCL 4 MG/2ML IJ SOLN
INTRAMUSCULAR | Status: DC | PRN
  Administered 2017-04-22: 4 mg via INTRAVENOUS

## 2017-04-22 MED ORDER — DEXAMETHASONE SODIUM PHOSPHATE 10 MG/ML IJ SOLN
INTRAMUSCULAR | Status: DC | PRN
  Administered 2017-04-22: 10 mg via INTRAVENOUS

## 2017-04-22 MED ORDER — FENTANYL CITRATE (PF) 100 MCG/2ML IJ SOLN: 25.0000 ug | INTRAMUSCULAR | Status: DC | PRN

## 2017-04-22 SURGICAL SUPPLY — 65 items
ANCHOR JUGGERKNOT 1.0 1DR 2-0 (Anchor) ×3 IMPLANT
ANCHOR JUGGERKNOT 1.0 3-0 NLD (Anchor) ×3 IMPLANT
BANDAGE ACE 3X5.8 VEL STRL LF (GAUZE/BANDAGES/DRESSINGS) ×3 IMPLANT
BLADE MINI RND TIP GREEN BEAV (BLADE) ×3 IMPLANT
BLADE SURG 15 STRL LF DISP TIS (BLADE) ×2 IMPLANT
BLADE SURG 15 STRL SS (BLADE) ×4
BNDG ELASTIC 2X5.8 VLCR STR LF (GAUZE/BANDAGES/DRESSINGS) IMPLANT
BNDG ESMARK 4X9 LF (GAUZE/BANDAGES/DRESSINGS) ×3 IMPLANT
BNDG GAUZE ELAST 4 BULKY (GAUZE/BANDAGES/DRESSINGS) ×3 IMPLANT
CHLORAPREP W/TINT 26ML (MISCELLANEOUS) ×3 IMPLANT
CORD BIPOLAR FORCEPS 12FT (ELECTRODE) ×3 IMPLANT
COVER BACK TABLE 60X90IN (DRAPES) ×3 IMPLANT
COVER MAYO STAND STRL (DRAPES) ×3 IMPLANT
CUFF TOURNIQUET SINGLE 18IN (TOURNIQUET CUFF) ×3 IMPLANT
DRAPE EXTREMITY T 121X128X90 (DRAPE) ×3 IMPLANT
DRAPE OEC MINIVIEW 54X84 (DRAPES) ×3 IMPLANT
DRAPE SURG 17X23 STRL (DRAPES) ×3 IMPLANT
DRSG PAD ABDOMINAL 8X10 ST (GAUZE/BANDAGES/DRESSINGS) ×3 IMPLANT
GAUZE SPONGE 4X4 12PLY STRL (GAUZE/BANDAGES/DRESSINGS) ×3 IMPLANT
GAUZE XEROFORM 1X8 LF (GAUZE/BANDAGES/DRESSINGS) ×3 IMPLANT
GLOVE BIO SURGEON STRL SZ7.5 (GLOVE) ×3 IMPLANT
GLOVE BIOGEL PI IND STRL 8 (GLOVE) ×1 IMPLANT
GLOVE BIOGEL PI IND STRL 8.5 (GLOVE) ×1 IMPLANT
GLOVE BIOGEL PI INDICATOR 8 (GLOVE) ×2
GLOVE BIOGEL PI INDICATOR 8.5 (GLOVE) ×2
GLOVE SURG ORTHO 8.0 STRL STRW (GLOVE) ×3 IMPLANT
GOWN STRL REUS W/ TWL LRG LVL3 (GOWN DISPOSABLE) ×1 IMPLANT
GOWN STRL REUS W/TWL LRG LVL3 (GOWN DISPOSABLE) ×2
GOWN STRL REUS W/TWL XL LVL3 (GOWN DISPOSABLE) ×3 IMPLANT
NEEDLE HYPO 25X1 1.5 SAFETY (NEEDLE) ×3 IMPLANT
NEEDLE KEITH (NEEDLE) IMPLANT
NS IRRIG 1000ML POUR BTL (IV SOLUTION) ×3 IMPLANT
PACK BASIN DAY SURGERY FS (CUSTOM PROCEDURE TRAY) ×3 IMPLANT
PAD CAST 3X4 CTTN HI CHSV (CAST SUPPLIES) ×1 IMPLANT
PAD CAST 4YDX4 CTTN HI CHSV (CAST SUPPLIES) ×1 IMPLANT
PADDING CAST ABS 4INX4YD NS (CAST SUPPLIES) ×2
PADDING CAST ABS COTTON 4X4 ST (CAST SUPPLIES) ×1 IMPLANT
PADDING CAST COTTON 3X4 STRL (CAST SUPPLIES) ×2
PADDING CAST COTTON 4X4 STRL (CAST SUPPLIES) ×2
PASSER SUT SWANSON 36MM LOOP (INSTRUMENTS) IMPLANT
SLEEVE SCD COMPRESS KNEE MED (MISCELLANEOUS) ×3 IMPLANT
SLING ARM FOAM STRAP LRG (SOFTGOODS) IMPLANT
SPLINT FAST PLASTER 5X30 (CAST SUPPLIES)
SPLINT PLASTER CAST FAST 5X30 (CAST SUPPLIES) IMPLANT
SPLINT PLASTER CAST XFAST 3X15 (CAST SUPPLIES) ×20 IMPLANT
SPLINT PLASTER XTRA FASTSET 3X (CAST SUPPLIES) ×40
STOCKINETTE 4X48 STRL (DRAPES) ×3 IMPLANT
SUT ETHIBOND 3-0 V-5 (SUTURE) IMPLANT
SUT ETHILON 3 0 PS 1 (SUTURE) IMPLANT
SUT ETHILON 4 0 PS 2 18 (SUTURE) ×3 IMPLANT
SUT FIBERWIRE 2-0 18 17.9 3/8 (SUTURE)
SUT FIBERWIRE 3-0 18 TAPR NDL (SUTURE)
SUT MERSILENE 2.0 SH NDLE (SUTURE) IMPLANT
SUT MERSILENE 4 0 P 3 (SUTURE) ×3 IMPLANT
SUT SILK 4 0 PS 2 (SUTURE) IMPLANT
SUT VIC AB 0 SH 27 (SUTURE) IMPLANT
SUT VIC AB 2-0 SH 27 (SUTURE)
SUT VIC AB 2-0 SH 27XBRD (SUTURE) IMPLANT
SUT VICRYL 4-0 PS2 18IN ABS (SUTURE) IMPLANT
SUTURE FIBERWR 2-0 18 17.9 3/8 (SUTURE) IMPLANT
SUTURE FIBERWR 3-0 18 TAPR NDL (SUTURE) IMPLANT
SYR BULB 3OZ (MISCELLANEOUS) ×3 IMPLANT
SYR CONTROL 10ML LL (SYRINGE) ×3 IMPLANT
TOWEL OR 17X24 6PK STRL BLUE (TOWEL DISPOSABLE) ×6 IMPLANT
UNDERPAD 30X30 (UNDERPADS AND DIAPERS) ×3 IMPLANT

## 2017-04-22 NOTE — Op Note (Signed)
I assisted Surgeon(s) and Role:    * Leanora Cover, MD - Primary on the Procedure(s): RIGHT THUMB [place] on 04/22/2017.  I provided assistance on this [place] as follows: setup approach,debridement, repair and closure with application of the dressings and splint.  Electronically signed by: Wynonia Sours, MD Date: 04/22/2017 Time: 4:24 PM

## 2017-04-22 NOTE — H&P (Signed)
  Zachary George is an 51 y.o. male.   Chief Complaint: right thumb collateral ligament tears HPI: 51 yo male with right thumb pain.  MRI shows tears of radial and ulnar collateral ligaments.  He wishes to have operative fixation.  Allergies:  Allergies  Allergen Reactions  . Tramadol Nausea Only    Past Medical History:  Diagnosis Date  . Allergic rhinitis, cause unspecified 09/05/2013  . IBS (irritable bowel syndrome)   . Left elbow pain 2011   tennis elbow    History reviewed. No pertinent surgical history.  Family History: Family History  Problem Relation Age of Onset  . Hypertension Father     Social History:   reports that  has never smoked. he has never used smokeless tobacco. He reports that he drinks alcohol. He reports that he does not use drugs.  Medications: Medications Prior to Admission  Medication Sig Dispense Refill  . famotidine (PEPCID) 20 MG tablet ONE AT BEDTIME 90 tablet 0  . fluticasone furoate-vilanterol (BREO ELLIPTA) 200-25 MCG/INH AEPB Inhale 1 puff into the lungs daily. 1 each 0  . HYDROcodone-acetaminophen (NORCO/VICODIN) 5-325 MG tablet Take 1 tablet by mouth 2 (two) times daily as needed for moderate pain. 60 tablet 0  . ibuprofen (ADVIL,MOTRIN) 600 MG tablet Take 1 tablet (600 mg total) by mouth every 8 (eight) hours as needed for moderate pain. 90 tablet 2  . tiZANidine (ZANAFLEX) 4 MG tablet TAKE 1 TABLET BY MOUTH EVERY 6 HOURS AS NEEDED FOR MUSCLE SPASMS. 60 tablet 3    No results found for this or any previous visit (from the past 48 hour(s)).  No results found.   A comprehensive review of systems was negative.  Blood pressure (!) 133/92, pulse 75, temperature 97.8 F (36.6 C), temperature source Oral, resp. rate 20, height 5\' 9"  (1.753 m), weight 85.7 kg (189 lb), SpO2 100 %.  General appearance: alert, cooperative and appears stated age Head: Normocephalic, without obvious abnormality, atraumatic Neck: supple, symmetrical, trachea  midline Resp: clear to auscultation bilaterally Cardio: regular rate and rhythm GI: non-tender Extremities: Intact sensation and capillary refill all digits.  +epl/fpl/io.  No wounds.  Pulses: 2+ and symmetric Skin: Skin color, texture, turgor normal. No rashes or lesions Neurologic: Grossly normal Incision/Wound:none  Assessment/Plan Right thumb radial and ulnar collateral ligament tears.  Plan operative repair of radial and ulnar collateral ligaments.  Risks, benefits, and alternatives of surgery were discussed and the patient agrees with the plan of care.   Zachary George R 04/22/2017, 2:47 PM

## 2017-04-22 NOTE — Anesthesia Procedure Notes (Signed)
Procedure Name: LMA Insertion Performed by: Jasman Pfeifle W, CRNA Pre-anesthesia Checklist: Patient identified, Emergency Drugs available, Suction available and Patient being monitored Patient Re-evaluated:Patient Re-evaluated prior to induction Oxygen Delivery Method: Circle system utilized Preoxygenation: Pre-oxygenation with 100% oxygen Induction Type: IV induction Ventilation: Mask ventilation without difficulty LMA: LMA inserted LMA Size: 4.0 Number of attempts: 1 Placement Confirmation: positive ETCO2 Tube secured with: Tape Dental Injury: Teeth and Oropharynx as per pre-operative assessment        

## 2017-04-22 NOTE — Op Note (Signed)
[id number] 

## 2017-04-22 NOTE — Discharge Instructions (Addendum)

## 2017-04-22 NOTE — Anesthesia Postprocedure Evaluation (Signed)
Anesthesia Post Note  Patient: Zachary George  Procedure(s) Performed: RIGHT THUMB REPAIR (Right Thumb) RADIAL AND ULNAR COLLATERAL LIGAMENT REPAIR (Right Thumb)     Patient location during evaluation: PACU Anesthesia Type: General Level of consciousness: awake and alert Pain management: pain level controlled Vital Signs Assessment: post-procedure vital signs reviewed and stable Respiratory status: spontaneous breathing, nonlabored ventilation, respiratory function stable and patient connected to nasal cannula oxygen Cardiovascular status: blood pressure returned to baseline and stable Postop Assessment: no apparent nausea or vomiting Anesthetic complications: no    Last Vitals:  Vitals:   04/22/17 1627 04/22/17 1630  BP: 111/78 105/75  Pulse: 89 87  Resp: 14 13  Temp:    SpO2: 100% 100%    Last Pain:  Vitals:   04/22/17 1627  TempSrc:   PainSc: Asleep                 Jago Carton

## 2017-04-22 NOTE — Anesthesia Preprocedure Evaluation (Signed)
Anesthesia Evaluation  Patient identified by MRN, date of birth, ID band Patient awake    Reviewed: Allergy & Precautions, NPO status , Patient's Chart, lab work & pertinent test results  Airway Mallampati: II  TM Distance: >3 FB Neck ROM: Full    Dental no notable dental hx.    Pulmonary neg pulmonary ROS,    Pulmonary exam normal breath sounds clear to auscultation       Cardiovascular + Peripheral Vascular Disease  negative cardio ROS Normal cardiovascular exam Rhythm:Regular Rate:Normal     Neuro/Psych  Headaches, negative neurological ROS  negative psych ROS   GI/Hepatic negative GI ROS, Neg liver ROS, GERD  Medicated,  Endo/Other  negative endocrine ROS  Renal/GU negative Renal ROS  negative genitourinary   Musculoskeletal negative musculoskeletal ROS (+) Arthritis , Osteoarthritis,    Abdominal   Peds negative pediatric ROS (+)  Hematology negative hematology ROS (+)   Anesthesia Other Findings   Reproductive/Obstetrics negative OB ROS                            Anesthesia Physical Anesthesia Plan  ASA: III  Anesthesia Plan: General   Post-op Pain Management:    Induction: Intravenous  PONV Risk Score and Plan: 1  Airway Management Planned: LMA  Additional Equipment:   Intra-op Plan:   Post-operative Plan:   Informed Consent:   Plan Discussed with: CRNA and Surgeon  Anesthesia Plan Comments: ( )       Anesthesia Quick Evaluation

## 2017-04-22 NOTE — Brief Op Note (Signed)
04/22/2017  4:24 PM  PATIENT:  Dellis Filbert Christopoulos  51 y.o. male  PRE-OPERATIVE DIAGNOSIS:  RIGHT THUMB COLLATERAL LIGAMENT TEARS  POST-OPERATIVE DIAGNOSIS:  RIGHT THUMB COLLATERAL LIGAMENT TEARS  PROCEDURE:  Procedure(s): RIGHT THUMB REPAIR (Right) RADIAL AND ULNAR COLLATERAL LIGAMENT REPAIR (Right)  SURGEON:  Surgeon(s) and Role:    * Leanora Cover, MD - Primary  PHYSICIAN ASSISTANT:   ASSISTANTS: Daryll Brod, MD   ANESTHESIA:   general  EBL:  2 mL   BLOOD ADMINISTERED:none  DRAINS: none   LOCAL MEDICATIONS USED:  MARCAINE     SPECIMEN:  No Specimen  DISPOSITION OF SPECIMEN:  N/A  COUNTS:  YES  TOURNIQUET:   Total Tourniquet Time Documented: Upper Arm (Right) - 47 minutes Total: Upper Arm (Right) - 47 minutes   DICTATION: .Other Dictation: Dictation Number [id number]  PLAN OF CARE: Discharge to home after PACU  PATIENT DISPOSITION:  PACU - hemodynamically stable.

## 2017-04-22 NOTE — Transfer of Care (Signed)
Immediate Anesthesia Transfer of Care Note  Patient: Zachary George  Procedure(s) Performed: RIGHT THUMB REPAIR (Right Thumb) RADIAL AND ULNAR COLLATERAL LIGAMENT REPAIR (Right Thumb)  Patient Location: PACU  Anesthesia Type:General  Level of Consciousness: awake and sedated  Airway & Oxygen Therapy: Patient Spontanous Breathing and Patient connected to face mask oxygen  Post-op Assessment: Report given to RN and Post -op Vital signs reviewed and stable  Post vital signs: Reviewed and stable  Last Vitals:  Vitals:   04/22/17 1241  BP: (!) 133/92  Pulse: 75  Resp: 20  Temp: 36.6 C  SpO2: 100%    Last Pain:  Vitals:   04/22/17 1241  TempSrc: Oral  PainSc: 4          Complications: No apparent anesthesia complications

## 2017-04-23 ENCOUNTER — Encounter (HOSPITAL_BASED_OUTPATIENT_CLINIC_OR_DEPARTMENT_OTHER): Payer: Self-pay | Admitting: Orthopedic Surgery

## 2017-04-23 NOTE — Op Note (Signed)
NAME:  Zachary George, Zachary George                ACCOUNT NO.:  [id number]  MEDICAL RECORD NO.:  [id number]  LOCATION:                                 FACILITY:  PHYSICIAN:  Leanora Cover, MD             DATE OF BIRTH:  DATE OF PROCEDURE:  04/22/2017 DATE OF DISCHARGE:                              OPERATIVE REPORT   PREOPERATIVE DIAGNOSIS:  Right thumb radial and ulnar collateral ligament tears at the metacarpophalangeal joint.  POSTOPERATIVE DIAGNOSIS:  Right thumb radial and ulnar collateral ligament tears at the metacarpophalangeal joint.  PROCEDURE:   1. Right thumb repair of radial collateral ligament 2. Right thumb repair of ulnar collateral ligament.  SURGEON:  Leanora Cover, MD.  ASSISTANT:  Daryll Brod, MD.  ANESTHESIA:  General.  IV FLUIDS:  Per anesthesia flow sheet.  ESTIMATED BLOOD LOSS:  Minimal.  COMPLICATIONS:  None.  SPECIMENS:  None.  TOURNIQUET TIME:  47 minutes.  DISPOSITION:  Stable to PACU.  INDICATIONS:  Zachary George is a 51 year old male who sustained injury to his right thumb approximately 2-3 months ago.  He has had MRI showing tears of the radial and ulnar collateral ligaments.  He wishes to undergo operative repair.  Risks, benefits, and alternatives of surgery were discussed including the risks of blood loss, infection, damage to nerves, vessels, tendons, ligaments, and bone, failure of surgery, need for additional surgery, complications with wound healing, continued pain, and stiffness and advanced arthritis.  He voiced understanding of these risks and elected to proceed.  OPERATIVE COURSE:  After being identified preoperatively by myself, the patient and I agreed upon the procedure and site of procedure.  Surgical site was marked.  The risks, benefits, and alternatives of surgery were reviewed and he wished to proceed.  Surgical consent had been signed. He was given IV Ancef as preoperative antibiotic prophylaxis.  He was transferred to the operating room  and placed on the operating room table in supine position with the right upper extremity on an arm board. General anesthesia was induced by anesthesiologist.  Right upper extremity was prepped and draped in a normal sterile orthopedic fashion. Surgical pause was performed between surgeons, Anesthesia, and operating room staff; and all were in agreement as to the patient, procedure, and site of procedure.  Tourniquet at the proximal aspect of the extremity was inflated to 250 mmHg after exsanguination of the limb with an Esmarch bandage.  An incision was made on the dorsum of the MP joint of the thumb, carried into subcutaneous tissues by spreading technique. Bipolar electrocautery was used to obtain hemostasis.  A Beaver blade was used to incise the extensor hood and capsule on the radial side of the joint dorsally.  The tear of the radial collateral ligament was identified.  It had torn from the metacarpal head.  The articular cartilage was visualized and it was intact.  An additional incision was made through the adductor aponeurosis at the ulnar side of the thumb and into the capsule.  The ulnar collateral ligament was identified and had torn from the proximal phalanx.  JuggerKnot suture anchors were used. Standard drilling placement technique was used.  One anchor was placed at the radial side in the metacarpal head and the other on the ulnar side in the proximal phalanx.  These were both used to secure the radial and ulnar collateral ligaments into the respective footprints.  There was good stability to the MP joint of the thumb after the repair.  The wound was copiously irrigated with sterile saline.  The adductor aponeurosis was repaired on the ulnar side with a running 4-0 Mersilene suture.  The capsule and extensor hood were repaired on the radial side with the 4-0 Mersilene as well.  The skin was closed with 4-0 nylon in a horizontal mattress fashion.  The area was injected with  0.25% plain Marcaine to aid in postoperative analgesia.  It was then dressed with sterile Xeroform, 4 x 4's and wrapped with a Kerlix bandage.  A thumb spica splint was placed and wrapped with a Kerlix and Ace bandage. Tourniquet was deflated at 47 minutes.  Fingertips were pink with brisk capillary refill after deflation of tourniquet.  The operative drapes were broken down and the patient was awakened from anesthesia safely. He was transferred back to stretcher and taken to PACU in stable condition.  I will see him back in the office in 1 week for postoperative followup.  I will give him Norco 5/325 one to two p.o. q.6 hours p.r.n. pain, dispensed #20.     Leanora Cover, MD     KK/MEDQ  D:  04/22/2017  T:  04/23/2017  Job:  [id number]

## 2017-05-02 ENCOUNTER — Other Ambulatory Visit: Payer: Self-pay | Admitting: Internal Medicine

## 2017-05-05 DIAGNOSIS — M79644 Pain in right finger(s): Secondary | ICD-10-CM | POA: Diagnosis not present

## 2017-06-10 DIAGNOSIS — M79644 Pain in right finger(s): Secondary | ICD-10-CM | POA: Diagnosis not present

## 2017-06-10 DIAGNOSIS — S5321XD Traumatic rupture of right radial collateral ligament, subsequent encounter: Secondary | ICD-10-CM | POA: Diagnosis not present

## 2017-06-10 DIAGNOSIS — M25641 Stiffness of right hand, not elsewhere classified: Secondary | ICD-10-CM | POA: Diagnosis not present

## 2017-06-10 DIAGNOSIS — S63641D Sprain of metacarpophalangeal joint of right thumb, subsequent encounter: Secondary | ICD-10-CM | POA: Diagnosis not present

## 2017-06-24 DIAGNOSIS — M79644 Pain in right finger(s): Secondary | ICD-10-CM | POA: Diagnosis not present

## 2017-06-24 DIAGNOSIS — M25641 Stiffness of right hand, not elsewhere classified: Secondary | ICD-10-CM | POA: Diagnosis not present

## 2017-06-24 DIAGNOSIS — S63641D Sprain of metacarpophalangeal joint of right thumb, subsequent encounter: Secondary | ICD-10-CM | POA: Diagnosis not present

## 2017-06-24 DIAGNOSIS — S5321XD Traumatic rupture of right radial collateral ligament, subsequent encounter: Secondary | ICD-10-CM | POA: Diagnosis not present

## 2017-06-29 DIAGNOSIS — S63641D Sprain of metacarpophalangeal joint of right thumb, subsequent encounter: Secondary | ICD-10-CM | POA: Diagnosis not present

## 2017-06-29 DIAGNOSIS — S5321XD Traumatic rupture of right radial collateral ligament, subsequent encounter: Secondary | ICD-10-CM | POA: Diagnosis not present

## 2017-06-29 DIAGNOSIS — M79644 Pain in right finger(s): Secondary | ICD-10-CM | POA: Diagnosis not present

## 2017-06-29 DIAGNOSIS — M25641 Stiffness of right hand, not elsewhere classified: Secondary | ICD-10-CM | POA: Diagnosis not present

## 2017-07-07 ENCOUNTER — Telehealth: Payer: Self-pay | Admitting: Internal Medicine

## 2017-07-07 NOTE — Telephone Encounter (Signed)
Copied from [place] [id number]. Topic: Quick Communication - Rx Refill/Question >> Jul 07, 2017  1:04 PM Synthia Innocent wrote: Medication:  HYDROcodone-acetaminophen ([place]) 5-325 MG tablet   Has the patient contacted their pharmacy? Yes.     (Agent: If no, request that the patient contact the pharmacy for the refill.)   Preferred Pharmacy (with phone number or street name): [facility] [address]   Agent: Please be advised that RX refills may take up to 3 business days. We ask that you follow-up with your pharmacy.

## 2017-07-08 ENCOUNTER — Other Ambulatory Visit: Payer: Self-pay | Admitting: Internal Medicine

## 2017-07-08 NOTE — Telephone Encounter (Signed)
LOV 04/05/17 Dr. Alain [facility] [address]

## 2017-07-09 MED ORDER — HYDROCODONE-ACETAMINOPHEN 5-325 MG PO TABS: ORAL_TABLET | ORAL | 0 refills | Status: DC

## 2017-07-13 NOTE — Addendum Note (Signed)
Addended by: Karren Cobble on: 07/13/2017 08:36 AM   Modules accepted: Orders

## 2017-07-13 NOTE — Addendum Note (Signed)
Addended by: Pricilla Holm A on: 07/13/2017 12:41 PM   Modules accepted: Orders

## 2017-07-13 NOTE — Telephone Encounter (Signed)
Pt is angry that his medication was denied.  He states that dr Sharlet Salina doe not know him and it should not be denied.  Pt is asking for call with explanation from clinical person. Please call [phone]

## 2017-07-13 NOTE — Telephone Encounter (Signed)
Will not fill as this is not a chronic med. Dr. Camila Li has not filled since July and since that time he has gotten two different narcotics from hand surgery. Can wait until PCP return or schedule acute visit for pain.

## 2017-07-14 NOTE — Telephone Encounter (Signed)
LMTCB

## 2017-07-15 NOTE — Telephone Encounter (Signed)
Explained everything to pt and made an appointment for pt to see dr Alain Marion 07/21/17

## 2017-07-21 ENCOUNTER — Ambulatory Visit (INDEPENDENT_AMBULATORY_CARE_PROVIDER_SITE_OTHER): Payer: BLUE CROSS/BLUE SHIELD | Admitting: Internal Medicine

## 2017-07-21 ENCOUNTER — Encounter: Payer: Self-pay | Admitting: Internal Medicine

## 2017-07-21 VITALS — BP 122/82 | HR 61 | Temp 98.0°F | Ht 69.0 in | Wt 196.0 lb

## 2017-07-21 DIAGNOSIS — M75101 Unspecified rotator cuff tear or rupture of right shoulder, not specified as traumatic: Secondary | ICD-10-CM | POA: Diagnosis not present

## 2017-07-21 DIAGNOSIS — Z0000 Encounter for general adult medical examination without abnormal findings: Secondary | ICD-10-CM

## 2017-07-21 DIAGNOSIS — Z1211 Encounter for screening for malignant neoplasm of colon: Secondary | ICD-10-CM

## 2017-07-21 DIAGNOSIS — G43809 Other migraine, not intractable, without status migrainosus: Secondary | ICD-10-CM | POA: Diagnosis not present

## 2017-07-21 DIAGNOSIS — F5102 Adjustment insomnia: Secondary | ICD-10-CM | POA: Diagnosis not present

## 2017-07-21 MED ORDER — VITAMIN D3 50 MCG (2000 UT) PO CAPS: 2000.0000 [IU] | ORAL_CAPSULE | Freq: Every day | ORAL | 3 refills | Status: AC

## 2017-07-21 MED ORDER — HYDROCODONE-ACETAMINOPHEN 5-325 MG PO TABS: ORAL_TABLET | ORAL | 0 refills | Status: DC

## 2017-07-21 NOTE — Progress Notes (Signed)
Subjective:  Patient ID: Zachary George, male    DOB: 1965/05/23  Age: 52 y.o. MRN: [id number]  CC: No chief complaint on file.   HPI Zachary George presents for R shoulder pain w/flare ups C/o R thumb injury w/a ligament repair F/u GERD, HAs  Outpatient Medications Prior to Visit  Medication Sig Dispense Refill  . BREO ELLIPTA 200-25 MCG/INH AEPB TAKE 1 PUFF BY MOUTH EVERY DAY 60 each 0  . famotidine (PEPCID) 20 MG tablet ONE AT BEDTIME 90 tablet 0  . HYDROcodone-acetaminophen (NORCO) 5-325 MG tablet 1-2 tabs po q6 hours prn pain 20 tablet 0  . HYDROcodone-acetaminophen (NORCO/VICODIN) 5-325 MG tablet Take 1 tablet by mouth 2 (two) times daily as needed for moderate pain. 60 tablet 0  . ibuprofen (ADVIL,MOTRIN) 600 MG tablet Take 1 tablet (600 mg total) by mouth every 8 (eight) hours as needed for moderate pain. 90 tablet 2  . tiZANidine (ZANAFLEX) 4 MG tablet TAKE 1 TABLET BY MOUTH EVERY 6 HOURS AS NEEDED FOR MUSCLE SPASMS. Patient needs office visit before refills will be given 60 tablet 0   No facility-administered medications prior to visit.     ROS Review of Systems  Constitutional: Negative for appetite change, fatigue and unexpected weight change.  HENT: Negative for congestion, nosebleeds, sneezing, sore throat and trouble swallowing.   Eyes: Negative for itching and visual disturbance.  Respiratory: Negative for cough.   Cardiovascular: Negative for chest pain, palpitations and leg swelling.  Gastrointestinal: Negative for abdominal distention, blood in stool, diarrhea and nausea.  Genitourinary: Negative for frequency and hematuria.  Musculoskeletal: Positive for back pain. Negative for gait problem, joint swelling and neck pain.  Skin: Negative for rash.  Neurological: Negative for dizziness, tremors, speech difficulty and weakness.  Psychiatric/Behavioral: Negative for agitation, dysphoric mood and sleep disturbance. The patient is not nervous/anxious.    R dorsal  thumb w/scars LS w/pain on ROM  Objective:  BP 122/82 (BP Location: Left Arm, Patient Position: Sitting, Cuff Size: Large)   Pulse 61   Temp 98 F (36.7 C) (Oral)   Ht 5\' 9"  (1.753 m)   Wt 196 lb (88.9 kg)   SpO2 98%   BMI 28.94 kg/m   BP Readings from Last 3 Encounters:  07/21/17 122/82  04/22/17 140/89  04/05/17 120/86    Wt Readings from Last 3 Encounters:  07/21/17 196 lb (88.9 kg)  04/22/17 189 lb (85.7 kg)  04/05/17 190 lb (86.2 kg)    Physical Exam  Constitutional: He is oriented to person, place, and time. He appears well-developed. No distress.  NAD  HENT:  Mouth/Throat: Oropharynx is clear and moist.  Eyes: Conjunctivae are normal. Pupils are equal, round, and reactive to light.  Neck: Normal range of motion. No JVD present. No thyromegaly present.  Cardiovascular: Normal rate, regular rhythm, normal heart sounds and intact distal pulses. Exam reveals no gallop and no friction rub.  No murmur heard. Pulmonary/Chest: Effort normal and breath sounds normal. No respiratory distress. He has no wheezes. He has no rales. He exhibits no tenderness.  Abdominal: Soft. Bowel sounds are normal. He exhibits no distension and no mass. There is no tenderness. There is no rebound and no guarding.  Musculoskeletal: Normal range of motion. He exhibits no edema or tenderness.  Lymphadenopathy:    He has no cervical adenopathy.  Neurological: He is alert and oriented to person, place, and time. He has normal reflexes. No cranial nerve deficit. He exhibits normal muscle tone. He displays  a negative Romberg sign. Coordination and gait normal.  Skin: Skin is warm and dry. No rash noted.  Psychiatric: He has a normal mood and affect. His behavior is normal. Judgment and thought content normal.    Lab Results  Component Value Date   WBC 6.6 08/18/2016   HGB 16.0 08/18/2016   HCT 47.3 08/18/2016   PLT 238.0 08/18/2016   GLUCOSE 100 (H) 08/18/2016   CHOL 217 (H) 05/22/2013   TRIG  48.0 05/22/2013   HDL 60.10 05/22/2013   LDLDIRECT 156.0 05/22/2013   LDLCALC 128 (H) 09/01/2011   ALT 26 08/18/2016   AST 20 08/18/2016   NA 139 08/18/2016   K 4.2 08/18/2016   CL 104 08/18/2016   CREATININE 1.01 08/18/2016   BUN 20 08/18/2016   CO2 29 08/18/2016   TSH 1.25 08/18/2016   PSA 1.45 09/01/2011    No results found.  Assessment & Plan:   There are no diagnoses linked to this encounter. I am having Zachary George maintain his ibuprofen, famotidine, HYDROcodone-acetaminophen, BREO ELLIPTA, HYDROcodone-acetaminophen, and tiZANidine.  No orders of the defined types were placed in this encounter.    Follow-up: No Follow-up on file.  Walker Kehr, MD

## 2017-07-21 NOTE — Assessment & Plan Note (Signed)
Ibuprofen prn 

## 2017-07-21 NOTE — Assessment & Plan Note (Signed)
Discussed.

## 2017-07-21 NOTE — Assessment & Plan Note (Signed)
Norco prn - rare  Potential benefits of a long term opioids use as well as potential risks (i.e. addiction risk, apnea etc) and complications (i.e. Somnolence, constipation and others) were explained to the patient and were aknowledged.

## 2017-07-22 DIAGNOSIS — S5321XD Traumatic rupture of right radial collateral ligament, subsequent encounter: Secondary | ICD-10-CM | POA: Diagnosis not present

## 2017-07-22 DIAGNOSIS — M25641 Stiffness of right hand, not elsewhere classified: Secondary | ICD-10-CM | POA: Diagnosis not present

## 2017-07-22 DIAGNOSIS — M79644 Pain in right finger(s): Secondary | ICD-10-CM | POA: Diagnosis not present

## 2017-07-22 DIAGNOSIS — S63641D Sprain of metacarpophalangeal joint of right thumb, subsequent encounter: Secondary | ICD-10-CM | POA: Diagnosis not present

## 2017-07-25 ENCOUNTER — Other Ambulatory Visit: Payer: Self-pay | Admitting: Internal Medicine

## 2017-07-28 DIAGNOSIS — S5321XD Traumatic rupture of right radial collateral ligament, subsequent encounter: Secondary | ICD-10-CM | POA: Diagnosis not present

## 2017-07-28 DIAGNOSIS — S63641D Sprain of metacarpophalangeal joint of right thumb, subsequent encounter: Secondary | ICD-10-CM | POA: Diagnosis not present

## 2017-07-28 DIAGNOSIS — M25641 Stiffness of right hand, not elsewhere classified: Secondary | ICD-10-CM | POA: Diagnosis not present

## 2017-07-28 DIAGNOSIS — R29898 Other symptoms and signs involving the musculoskeletal system: Secondary | ICD-10-CM | POA: Diagnosis not present

## 2017-08-04 DIAGNOSIS — M25641 Stiffness of right hand, not elsewhere classified: Secondary | ICD-10-CM | POA: Diagnosis not present

## 2017-08-04 DIAGNOSIS — R531 Weakness: Secondary | ICD-10-CM | POA: Diagnosis not present

## 2017-08-04 DIAGNOSIS — S63641D Sprain of metacarpophalangeal joint of right thumb, subsequent encounter: Secondary | ICD-10-CM | POA: Diagnosis not present

## 2017-08-23 ENCOUNTER — Encounter: Payer: Self-pay | Admitting: Internal Medicine

## 2017-09-08 ENCOUNTER — Ambulatory Visit (INDEPENDENT_AMBULATORY_CARE_PROVIDER_SITE_OTHER): Payer: BLUE CROSS/BLUE SHIELD | Admitting: Internal Medicine

## 2017-09-08 ENCOUNTER — Encounter: Payer: Self-pay | Admitting: Internal Medicine

## 2017-09-08 DIAGNOSIS — J209 Acute bronchitis, unspecified: Secondary | ICD-10-CM | POA: Diagnosis not present

## 2017-09-08 DIAGNOSIS — M25511 Pain in right shoulder: Secondary | ICD-10-CM

## 2017-09-08 MED ORDER — HYDROCODONE-ACETAMINOPHEN 5-325 MG PO TABS: ORAL_TABLET | ORAL | 0 refills | Status: DC

## 2017-09-08 MED ORDER — HYDROCODONE-HOMATROPINE 5-1.5 MG/5ML PO SYRP: 5.0000 mL | ORAL_SOLUTION | Freq: Four times a day (QID) | ORAL | 0 refills | Status: AC | PRN

## 2017-09-08 MED ORDER — AZITHROMYCIN 250 MG PO TABS: ORAL_TABLET | ORAL | 0 refills | Status: DC

## 2017-09-08 NOTE — Assessment & Plan Note (Signed)
Norco prn - rare use  Potential benefits of a long term opioids use as well as potential risks (i.e. addiction risk, apnea etc) and complications (i.e. Somnolence, constipation and others) were explained to the patient and were aknowledged. Not to take w/cough syrup

## 2017-09-08 NOTE — Progress Notes (Signed)
Subjective:  Patient ID: Zachary George, male    DOB: April 08, 1966  Age: 52 y.o. MRN: [id number]  CC: No chief complaint on file.   HPI Zachary George presents for URI sx's since Monday. Coughing up green mucus  Outpatient Medications Prior to Visit  Medication Sig Dispense Refill  . BREO ELLIPTA 200-25 MCG/INH AEPB TAKE 1 PUFF BY MOUTH EVERY DAY 60 each 0  . Cholecalciferol (VITAMIN D3) 2000 units capsule Take 1 capsule (2,000 Units total) by mouth daily. 100 capsule 3  . famotidine (PEPCID) 20 MG tablet ONE AT BEDTIME 90 tablet 0  . HYDROcodone-acetaminophen (NORCO) 5-325 MG tablet 1-2 tabs po q6 hours prn pain 40 tablet 0  . ibuprofen (ADVIL,MOTRIN) 600 MG tablet Take 1 tablet (600 mg total) by mouth every 8 (eight) hours as needed for moderate pain. 90 tablet 2  . tiZANidine (ZANAFLEX) 4 MG tablet TAKE 1 TAB BY MOUTH EVERY 6 HRS AS NEEDED FOR MUSCLE SPASMS. NEEDS OFFICE VISIT PER DOC 60 tablet 1   No facility-administered medications prior to visit.     ROS Review of Systems  Constitutional: Positive for chills. Negative for appetite change, fatigue and unexpected weight change.  HENT: Positive for congestion. Negative for nosebleeds, sneezing, sore throat and trouble swallowing.   Eyes: Negative for itching and visual disturbance.  Respiratory: Positive for cough.   Cardiovascular: Negative for chest pain, palpitations and leg swelling.  Gastrointestinal: Negative for abdominal distention, blood in stool, diarrhea and nausea.  Genitourinary: Negative for frequency and hematuria.  Musculoskeletal: Positive for myalgias. Negative for back pain, gait problem, joint swelling and neck pain.  Skin: Negative for rash.  Neurological: Negative for dizziness, tremors, speech difficulty and weakness.  Psychiatric/Behavioral: Negative for agitation, dysphoric mood and sleep disturbance. The patient is not nervous/anxious.     Objective:  BP 126/78 (BP Location: Left Arm, Patient Position:  Sitting, Cuff Size: Large)   Pulse 77   Temp 98.4 F (36.9 C) (Oral)   Ht 5\' 9"  (1.753 m)   Wt 190 lb (86.2 kg)   SpO2 99%   BMI 28.06 kg/m   BP Readings from Last 3 Encounters:  09/08/17 126/78  07/21/17 122/82  04/22/17 140/89    Wt Readings from Last 3 Encounters:  09/08/17 190 lb (86.2 kg)  07/21/17 196 lb (88.9 kg)  04/22/17 189 lb (85.7 kg)    Physical Exam  Constitutional: He is oriented to person, place, and time. He appears well-developed. No distress.  NAD  HENT:  Mouth/Throat: Oropharynx is clear and moist.  Eyes: Pupils are equal, round, and reactive to light. Conjunctivae are normal.  Neck: Normal range of motion. No JVD present. No thyromegaly present.  Cardiovascular: Normal rate, regular rhythm, normal heart sounds and intact distal pulses. Exam reveals no gallop and no friction rub.  No murmur heard. Pulmonary/Chest: Effort normal and breath sounds normal. No respiratory distress. He has no wheezes. He has no rales. He exhibits no tenderness.  Abdominal: Soft. Bowel sounds are normal. He exhibits no distension and no mass. There is no tenderness. There is no rebound and no guarding.  Musculoskeletal: Normal range of motion. He exhibits no edema or tenderness.  Lymphadenopathy:    He has no cervical adenopathy.  Neurological: He is alert and oriented to person, place, and time. He has normal reflexes. No cranial nerve deficit. He exhibits normal muscle tone. He displays a negative Romberg sign. Coordination and gait normal.  Skin: Skin is warm and dry. No rash  noted.  Psychiatric: He has a normal mood and affect. His behavior is normal. Judgment and thought content normal.   eryth throat Coughing a lot  Lab Results  Component Value Date   WBC 6.6 08/18/2016   HGB 16.0 08/18/2016   HCT 47.3 08/18/2016   PLT 238.0 08/18/2016   GLUCOSE 100 (H) 08/18/2016   CHOL 217 (H) 05/22/2013   TRIG 48.0 05/22/2013   HDL 60.10 05/22/2013   LDLDIRECT 156.0  05/22/2013   LDLCALC 128 (H) 09/01/2011   ALT 26 08/18/2016   AST 20 08/18/2016   NA 139 08/18/2016   K 4.2 08/18/2016   CL 104 08/18/2016   CREATININE 1.01 08/18/2016   BUN 20 08/18/2016   CO2 29 08/18/2016   TSH 1.25 08/18/2016   PSA 1.45 09/01/2011    No results found.  Assessment & Plan:   There are no diagnoses linked to this encounter. I am having Zachary George maintain his ibuprofen, famotidine, BREO ELLIPTA, HYDROcodone-acetaminophen, Vitamin D3, and tiZANidine.  No orders of the defined types were placed in this encounter.    Follow-up: No follow-ups on file.  Walker Kehr, MD

## 2017-09-08 NOTE — Assessment & Plan Note (Signed)
Zpac Hycodan syr prn

## 2017-09-08 NOTE — Patient Instructions (Signed)
You can use over-the-counter  "cold" medicines  such as "Tylenol cold" , "Advil cold",  "Mucinex" or" Mucinex D"  for cough and congestion.   Avoid decongestants if you have high blood pressure and use "Afrin" nasal spray for nasal congestion as directed. Use " Delsym" or" Robitussin" cough syrup varietis for cough.  You can use plain "Tylenol" or "Advil" for fever, chills and achyness. Use Halls or Ricola cough drops.   Please, make an appointment if you are not better or if you're worse.  

## 2017-09-22 DIAGNOSIS — S63641D Sprain of metacarpophalangeal joint of right thumb, subsequent encounter: Secondary | ICD-10-CM | POA: Diagnosis not present

## 2017-09-22 DIAGNOSIS — S5321XD Traumatic rupture of right radial collateral ligament, subsequent encounter: Secondary | ICD-10-CM | POA: Diagnosis not present

## 2017-11-29 ENCOUNTER — Other Ambulatory Visit: Payer: Self-pay | Admitting: *Deleted

## 2017-11-29 DIAGNOSIS — M79644 Pain in right finger(s): Secondary | ICD-10-CM | POA: Diagnosis not present

## 2017-11-29 NOTE — Telephone Encounter (Signed)
Rf req for Hydrocodone/APAP 5-325 1-2 tabs po q 6 hr prn pain. Last written 09/08/17 #40.   Ok to Rf?

## 2017-11-30 ENCOUNTER — Other Ambulatory Visit: Payer: Self-pay | Admitting: Internal Medicine

## 2017-12-01 MED ORDER — HYDROCODONE-ACETAMINOPHEN 5-325 MG PO TABS: ORAL_TABLET | ORAL | 0 refills | Status: DC

## 2018-01-02 ENCOUNTER — Other Ambulatory Visit: Payer: Self-pay | Admitting: Internal Medicine

## 2018-04-22 ENCOUNTER — Encounter: Payer: Self-pay | Admitting: Family Medicine

## 2018-04-22 ENCOUNTER — Ambulatory Visit (INDEPENDENT_AMBULATORY_CARE_PROVIDER_SITE_OTHER): Payer: BLUE CROSS/BLUE SHIELD | Admitting: Family Medicine

## 2018-04-22 VITALS — BP 130/80 | HR 73 | Temp 97.6°F | Ht 69.0 in | Wt 193.4 lb

## 2018-04-22 DIAGNOSIS — J0130 Acute sphenoidal sinusitis, unspecified: Secondary | ICD-10-CM

## 2018-04-22 MED ORDER — PREDNISONE 10 MG PO TABS: 10.0000 mg | ORAL_TABLET | Freq: Two times a day (BID) | ORAL | 0 refills | Status: AC

## 2018-04-22 MED ORDER — HYDROCODONE-HOMATROPINE 5-1.5 MG/5ML PO SYRP
5.0000 mL | ORAL_SOLUTION | Freq: Three times a day (TID) | ORAL | 0 refills | Status: DC | PRN
Start: 2018-04-22 — End: 2018-05-06

## 2018-04-22 MED ORDER — DOXYCYCLINE HYCLATE 100 MG PO TABS: 100.0000 mg | ORAL_TABLET | Freq: Two times a day (BID) | ORAL | 0 refills | Status: DC

## 2018-04-22 NOTE — Progress Notes (Signed)
Established Patient Office Visit  Subjective:  Patient ID: Zachary George, male    DOB: [birth year]  Age: 52 y.o. MRN: [id number]  CC:  Chief Complaint  Patient presents with  . Headache    HPI Zachary George presents for evaluation of a headache that has been ongoing for 4 days. He has had sinus pressure and pressure around his eyes for about a week. He had started to feel better, but it came back on Sunday. He has taken decongestants, ibuprofen, and BC powder with no relief.  This started with a cold that he picked up from his granddaughter he believes 2 weeks ago.  He been dealing with stuffy nose drainage cough and it seemed to improve.  But, over the last 4 to 5 days he is developed a headache behind his eyes postnasal drip, cough productive of purulent phlegm with some purulent rhinorrhea as well.  There is been no fevers chills or wheezing.  There is been some left ear congestion.  The headache behind his eyes and is also been in the frontal area as well.  There is been some nausea associated with the headache.  He is taking up to 4 ibuprofen and a BC powder.  He has no headache history.  There were no scotomata.  Headache has not been progressive.  There is been no rash.  Past Medical History:  Diagnosis Date  . Allergic rhinitis, cause unspecified 09/05/2013  . IBS (irritable bowel syndrome)   . Left elbow pain 2011   tennis elbow    Past Surgical History:  Procedure Laterality Date  . LIGAMENT REPAIR Right 04/22/2017   Procedure: RIGHT THUMB REPAIR;  Surgeon: Leanora Cover, MD;  Location: [facility];  Service: Orthopedics;  Laterality: Right;  . ULNAR COLLATERAL LIGAMENT REPAIR Right 04/22/2017   Procedure: RADIAL AND ULNAR COLLATERAL LIGAMENT REPAIR;  Surgeon: Leanora Cover, MD;  Location: [facility];  Service: Orthopedics;  Laterality: Right;    Family History  Problem Relation Age of Onset  . Hypertension Father     Social History    Socioeconomic History  . Marital status: Married    Spouse name: Not on file  . Number of children: Not on file  . Years of education: Not on file  . Highest education level: Not on file  Occupational History  . Not on file  Social Needs  . Financial resource strain: Not on file  . Food insecurity:    Worry: Not on file    Inability: Not on file  . Transportation needs:    Medical: Not on file    Non-medical: Not on file  Tobacco Use  . Smoking status: Never Smoker  . Smokeless tobacco: Never Used  Substance and Sexual Activity  . Alcohol use: Yes    Alcohol/week: 0.0 standard drinks    Comment: rare  . Drug use: No  . Sexual activity: Yes  Lifestyle  . Physical activity:    Days per week: Not on file    Minutes per session: Not on file  . Stress: Not on file  Relationships  . Social connections:    Talks on phone: Not on file    Gets together: Not on file    Attends religious service: Not on file    Active member of club or organization: Not on file    Attends meetings of clubs or organizations: Not on file    Relationship status: Not on file  . Intimate partner violence:  Fear of current or ex partner: Not on file    Emotionally abused: Not on file    Physically abused: Not on file    Forced sexual activity: Not on file  Other Topics Concern  . Not on file  Social History Narrative  . Not on file    Outpatient Medications Prior to Visit  Medication Sig Dispense Refill  . BREO ELLIPTA 200-25 MCG/INH AEPB TAKE 1 PUFF BY MOUTH EVERY DAY 60 each 0  . Cholecalciferol (VITAMIN D3) 2000 units capsule Take 1 capsule (2,000 Units total) by mouth daily. 100 capsule 3  . famotidine (PEPCID) 20 MG tablet ONE AT BEDTIME 90 tablet 0  . HYDROcodone-acetaminophen (NORCO) 5-325 MG tablet 1-2 tabs po q6 hours prn pain 40 tablet 0  . ibuprofen (ADVIL,MOTRIN) 600 MG tablet Take 1 tablet (600 mg total) by mouth every 8 (eight) hours as needed for moderate pain. 90 tablet 2   . tiZANidine (ZANAFLEX) 4 MG tablet TAKE 1 TAB BY MOUTH EVERY 6 HRS AS NEEDED FOR MUSCLE SPASMS. NEEDS OFFICE VISIT PER DOC 60 tablet 1  . azithromycin (ZITHROMAX Z-PAK) 250 MG tablet As directed 6 each 0   No facility-administered medications prior to visit.     Allergies  Allergen Reactions  . Tramadol Nausea Only    ROS Review of Systems  Constitutional: Negative for chills, fatigue, fever and unexpected weight change.  HENT: Positive for congestion, postnasal drip, rhinorrhea, sinus pressure and sinus pain. Negative for ear discharge, ear pain, hearing loss and trouble swallowing.   Eyes: Negative for photophobia and visual disturbance.  Respiratory: Positive for cough. Negative for shortness of breath and wheezing.   Cardiovascular: Negative.   Gastrointestinal: Negative.   Genitourinary: Negative.   Musculoskeletal: Negative for arthralgias and myalgias.  Skin: Negative for pallor and rash.  Allergic/Immunologic: Negative for immunocompromised state.  Neurological: Positive for headaches. Negative for speech difficulty, weakness, light-headedness and numbness.  Hematological: Does not bruise/bleed easily.  Psychiatric/Behavioral: Negative.       Objective:    Physical Exam  Constitutional: He is oriented to person, place, and time. He appears well-developed and well-nourished. No distress.  HENT:  Head: Normocephalic and atraumatic.  Right Ear: External ear normal.  Left Ear: External ear normal.  Mouth/Throat: Oropharynx is clear and moist. No oropharyngeal exudate.  Eyes: Pupils are equal, round, and reactive to light. Conjunctivae are normal. Right eye exhibits no discharge. Left eye exhibits no discharge. No scleral icterus.  Fundoscopic exam:      The right eye shows no papilledema.       The left eye shows no papilledema.  Neck: Normal range of motion. Neck supple. No JVD present. No tracheal deviation present. No thyromegaly present.  Cardiovascular: Normal  rate, regular rhythm and normal heart sounds.  Pulmonary/Chest: Effort normal and breath sounds normal. No respiratory distress. He has no wheezes. He has no rales.  Abdominal: Bowel sounds are normal.  Lymphadenopathy:    He has no cervical adenopathy.  Neurological: He is alert and oriented to person, place, and time. No cranial nerve deficit.  Skin: Skin is warm and dry. He is not diaphoretic.  Psychiatric: He has a normal mood and affect. His behavior is normal.    BP 130/80   Pulse 73   Temp 97.6 F (36.4 C) (Oral)   Ht 5\' 9"  (1.753 m)   Wt 193 lb 6 oz (87.7 kg)   SpO2 97%   BMI 28.56 kg/m  Wt Readings from  Last 3 Encounters:  04/22/18 193 lb 6 oz (87.7 kg)  09/08/17 190 lb (86.2 kg)  07/21/17 196 lb (88.9 kg)   BP Readings from Last 3 Encounters:  04/22/18 130/80  09/08/17 126/78  07/21/17 122/82   Guideline developer:  UpToDate (see UpToDate for funding source) Date Released: June 2014  Health Maintenance Due  Topic Date Due  . HIV Screening  07/28/1982  . COLONOSCOPY  07/27/2017  . INFLUENZA VACCINE  12/02/2017    There are no preventive care reminders to display for this patient.  Lab Results  Component Value Date   TSH 1.25 08/18/2016   Lab Results  Component Value Date   WBC 6.6 08/18/2016   HGB 16.0 08/18/2016   HCT 47.3 08/18/2016   MCV 88.2 08/18/2016   PLT 238.0 08/18/2016   Lab Results  Component Value Date   NA 139 08/18/2016   K 4.2 08/18/2016   CO2 29 08/18/2016   GLUCOSE 100 (H) 08/18/2016   BUN 20 08/18/2016   CREATININE 1.01 08/18/2016   BILITOT 0.5 08/18/2016   ALKPHOS 64 08/18/2016   AST 20 08/18/2016   ALT 26 08/18/2016   PROT 6.9 08/18/2016   ALBUMIN 4.7 08/18/2016   CALCIUM 9.8 08/18/2016   GFR 83.43 08/18/2016   Lab Results  Component Value Date   CHOL 217 (H) 05/22/2013   Lab Results  Component Value Date   HDL 60.10 05/22/2013   Lab Results  Component Value Date   LDLCALC 128 (H) 09/01/2011   Lab Results   Component Value Date   TRIG 48.0 05/22/2013   Lab Results  Component Value Date   CHOLHDL 4 05/22/2013   No results found for: HGBA1C    Assessment & Plan:   Problem List Items Addressed This Visit      Respiratory   Acute non-recurrent sphenoidal sinusitis - Primary   Relevant Medications   doxycycline (VIBRA-TABS) 100 MG tablet   predniSONE (DELTASONE) 10 MG tablet   HYDROcodone-homatropine (HYCODAN) 5-1.5 MG/5ML syrup      Meds ordered this encounter  Medications  . doxycycline (VIBRA-TABS) 100 MG tablet    Sig: Take 1 tablet (100 mg total) by mouth 2 (two) times daily.    Dispense:  20 tablet    Refill:  0  . predniSONE (DELTASONE) 10 MG tablet    Sig: Take 1 tablet (10 mg total) by mouth 2 (two) times daily with a meal for 7 days.    Dispense:  14 tablet    Refill:  0  . HYDROcodone-homatropine (HYCODAN) 5-1.5 MG/5ML syrup    Sig: Take 5 mLs by mouth every 8 (eight) hours as needed for cough.    Dispense:  120 mL    Refill:  0    Follow-up: Return in about 1 week (around 04/29/2018).   Patient is concerned.  I am 4.  His presentation is consistent with a deep sinus infection.  We will treat that and he will follow-up in 1 week for recheck.

## 2018-05-03 ENCOUNTER — Other Ambulatory Visit: Payer: Self-pay | Admitting: Internal Medicine

## 2018-05-03 DIAGNOSIS — J0130 Acute sphenoidal sinusitis, unspecified: Secondary | ICD-10-CM

## 2018-05-03 NOTE — Telephone Encounter (Signed)
Copied from [facility] [id number]. Topic: Quick Communication - Rx Refill/Question >> May 03, 2018 11:17 AM Yvette Rack wrote: Medication: HYDROcodone-homatropine (HYCODAN) 5-1.5 MG/5ML syrup Pt still have cough at night  Has the patient contacted their pharmacy? yes (Agent: If no, request that the patient contact the pharmacy for the refill.) (Agent: If yes, when and what did the pharmacy advise?) called today told him to call provider  Preferred Pharmacy (with phone number or street name):     [id number] [address], [place]. [phone] (Phone) [phone] (Fax)    Agent: Please be advised that RX refills may take up to 3 business days. We ask that you follow-up with your pharmacy.

## 2018-05-06 ENCOUNTER — Encounter: Payer: Self-pay | Admitting: Family Medicine

## 2018-05-06 ENCOUNTER — Ambulatory Visit (INDEPENDENT_AMBULATORY_CARE_PROVIDER_SITE_OTHER): Payer: 59 | Admitting: Family Medicine

## 2018-05-06 DIAGNOSIS — J0130 Acute sphenoidal sinusitis, unspecified: Secondary | ICD-10-CM

## 2018-05-06 MED ORDER — METHYLPREDNISOLONE ACETATE 40 MG/ML IJ SUSP
40.0000 mg | Freq: Once | INTRAMUSCULAR | Status: AC
  Administered 2018-05-06: 40 mg via INTRAMUSCULAR

## 2018-05-06 MED ORDER — HYDROCODONE-HOMATROPINE 5-1.5 MG/5ML PO SYRP: 5.0000 mL | ORAL_SOLUTION | Freq: Three times a day (TID) | ORAL | 0 refills | Status: DC | PRN

## 2018-05-06 NOTE — Patient Instructions (Signed)
Good to see you  Please try things such as zyrtec-D or allegra-D which is an antihistamine and decongestant.  Please try afrin which will help with nasal congestion but use for only three days.  Please also try using a netti pot on a regular occasion. Please try honey, vick's vapor rub, lozenges and humidifer for cough and sore throat  Please email me in the middle of next week if no better.

## 2018-05-06 NOTE — Progress Notes (Signed)
Zachary George - 53 y.o. male MRN [id number]  Date of birth: 04/08/1966  SUBJECTIVE:  Including CC & ROS.  No chief complaint on file.    Zachary George is a 53 y.o. male that is presenting with cough and congestion.  He was seen on 12/20 and prescribed doxycycline and prednisone.  He says he felt improvement up until Monday.  Since that time he felt like he has been getting worse.  He has had a cough that is worse at night.  He has been traveling over the holidays.  Denies any reported fevers.  His cough is worse at night.  Has tried over-the-counter medications.   Review of Systems  Constitutional: Negative for fever.  HENT: Positive for congestion.   Respiratory: Positive for cough.   Cardiovascular: Negative for chest pain.  Gastrointestinal: Negative for abdominal pain.  Musculoskeletal: Negative for back pain.    HISTORY: Past Medical, Surgical, Social, and Family History Reviewed & Updated per EMR.   Pertinent Historical Findings include:  Past Medical History:  Diagnosis Date  . Allergic rhinitis, cause unspecified 09/05/2013  . IBS (irritable bowel syndrome)   . Left elbow pain 2011   tennis elbow    Past Surgical History:  Procedure Laterality Date  . LIGAMENT REPAIR Right 04/22/2017   Procedure: RIGHT THUMB REPAIR;  Surgeon: Leanora Cover, MD;  Location: [facility];  Service: Orthopedics;  Laterality: Right;  . ULNAR COLLATERAL LIGAMENT REPAIR Right 04/22/2017   Procedure: RADIAL AND ULNAR COLLATERAL LIGAMENT REPAIR;  Surgeon: Leanora Cover, MD;  Location: [facility];  Service: Orthopedics;  Laterality: Right;    Allergies  Allergen Reactions  . Tramadol Nausea Only    Family History  Problem Relation Age of Onset  . Hypertension Father      Social History   Socioeconomic History  . Marital status: Married    Spouse name: Not on file  . Number of children: Not on file  . Years of education: Not on file  . Highest education level:  Not on file  Occupational History  . Not on file  Social Needs  . Financial resource strain: Not on file  . Food insecurity:    Worry: Not on file    Inability: Not on file  . Transportation needs:    Medical: Not on file    Non-medical: Not on file  Tobacco Use  . Smoking status: Never Smoker  . Smokeless tobacco: Never Used  Substance and Sexual Activity  . Alcohol use: Yes    Alcohol/week: 0.0 standard drinks    Comment: rare  . Drug use: No  . Sexual activity: Yes  Lifestyle  . Physical activity:    Days per week: Not on file    Minutes per session: Not on file  . Stress: Not on file  Relationships  . Social connections:    Talks on phone: Not on file    Gets together: Not on file    Attends religious service: Not on file    Active member of club or organization: Not on file    Attends meetings of clubs or organizations: Not on file    Relationship status: Not on file  . Intimate partner violence:    Fear of current or ex partner: Not on file    Emotionally abused: Not on file    Physically abused: Not on file    Forced sexual activity: Not on file  Other Topics Concern  . Not on file  Social History Narrative  . Not on file     PHYSICAL EXAM:  VS: BP 134/90   Pulse 85   Temp 97.9 F (36.6 C) (Oral)   Resp 16   Ht 5\' 9"  (1.753 m)   Wt 194 lb (88 kg)   SpO2 98%   BMI 28.65 kg/m  Physical Exam Gen: NAD, alert, cooperative with exam, well-appearing ENT: normal lips, normal nasal mucosa, tympanic membranes clear and intact bilaterally, normal oropharynx, no cervical lymphadenopathy Eye: normal EOM, normal conjunctiva and lids CV:  no edema, +2 pedal pulses, regular rate and rhythm, S1-S2   Resp: no accessory muscle use, non-labored, clear to auscultation bilaterally, no crackles or wheezes Skin: no rashes, no areas of induration  Neuro: normal tone, normal sensation to touch Psych:  normal insight, alert and oriented MSK: Normal gait, normal strength         ASSESSMENT & PLAN:   Acute non-recurrent sphenoidal sinusitis Has ongoing symptoms. Likely viral  - IM depo  - hycodan. Reviewed [place] database  - counseled on supportive care - given indications to follow up.

## 2018-05-09 NOTE — Assessment & Plan Note (Signed)
Has ongoing symptoms. Likely viral  - IM depo  - hycodan. Reviewed [place] database  - counseled on supportive care - given indications to follow up.

## 2018-05-13 ENCOUNTER — Ambulatory Visit (INDEPENDENT_AMBULATORY_CARE_PROVIDER_SITE_OTHER): Payer: 59 | Admitting: Family Medicine

## 2018-05-13 ENCOUNTER — Encounter: Payer: Self-pay | Admitting: Family Medicine

## 2018-05-13 ENCOUNTER — Ambulatory Visit (INDEPENDENT_AMBULATORY_CARE_PROVIDER_SITE_OTHER): Payer: 59

## 2018-05-13 VITALS — BP 124/80 | HR 62 | Temp 97.8°F | Ht 69.0 in | Wt 192.5 lb

## 2018-05-13 DIAGNOSIS — J329 Chronic sinusitis, unspecified: Secondary | ICD-10-CM

## 2018-05-13 DIAGNOSIS — R41840 Attention and concentration deficit: Secondary | ICD-10-CM

## 2018-05-13 MED ORDER — MOMETASONE FUROATE 50 MCG/ACT NA SUSP: NASAL | 1 refills | Status: DC

## 2018-05-13 NOTE — Progress Notes (Signed)
Established Patient Office Visit  Subjective:  Patient ID: Zachary George, male    DOB: [birth year]  Age: 53 y.o. MRN: [id number]  CC:  Chief Complaint  Patient presents with  . Sinus Problem    HPI Zachary George presents for evaluation of a 4 to 5-week history of intermittent URI symptoms.  These have included facial pressure in the cheekbones and frontal area with rhinorrhea and postnasal drip.  More recently he is been experiencing headaches in the bitemporal area.  He feels as though these are sinus related because they seem to respond to pseudoephedrine.  Recently his drainage has been clear and there is been no fever.  He was seen on 12/20 and treated then with 10 days of doxycycline clean and prednisone 10 mg twice daily for 7 days.  He received on codeine cough syrup.  Seen again last week and given a Depo-Medrol injection and more codeine cough syrup.  He tells of problems also of focus over the last several months.  He has had some difficulty concentrating.  A friend of his had given him an Adderall to try.  And he said that this made him feel like his old self.  He has no past medical history of ADHD.  Past Medical History:  Diagnosis Date  . Allergic rhinitis, cause unspecified 09/05/2013  . IBS (irritable bowel syndrome)   . Left elbow pain 2011   tennis elbow    Past Surgical History:  Procedure Laterality Date  . LIGAMENT REPAIR Right 04/22/2017   Procedure: RIGHT THUMB REPAIR;  Surgeon: Leanora Cover, MD;  Location: [facility];  Service: Orthopedics;  Laterality: Right;  . ULNAR COLLATERAL LIGAMENT REPAIR Right 04/22/2017   Procedure: RADIAL AND ULNAR COLLATERAL LIGAMENT REPAIR;  Surgeon: Leanora Cover, MD;  Location: [facility];  Service: Orthopedics;  Laterality: Right;    Family History  Problem Relation Age of Onset  . Hypertension Father     Social History   Socioeconomic History  . Marital status: Married    Spouse name: Not on  file  . Number of children: Not on file  . Years of education: Not on file  . Highest education level: Not on file  Occupational History  . Not on file  Social Needs  . Financial resource strain: Not on file  . Food insecurity:    Worry: Not on file    Inability: Not on file  . Transportation needs:    Medical: Not on file    Non-medical: Not on file  Tobacco Use  . Smoking status: Never Smoker  . Smokeless tobacco: Never Used  Substance and Sexual Activity  . Alcohol use: Yes    Alcohol/week: 0.0 standard drinks    Comment: rare  . Drug use: No  . Sexual activity: Yes  Lifestyle  . Physical activity:    Days per week: Not on file    Minutes per session: Not on file  . Stress: Not on file  Relationships  . Social connections:    Talks on phone: Not on file    Gets together: Not on file    Attends religious service: Not on file    Active member of club or organization: Not on file    Attends meetings of clubs or organizations: Not on file    Relationship status: Not on file  . Intimate partner violence:    Fear of current or ex partner: Not on file    Emotionally abused: Not  on file    Physically abused: Not on file    Forced sexual activity: Not on file  Other Topics Concern  . Not on file  Social History Narrative  . Not on file    Outpatient Medications Prior to Visit  Medication Sig Dispense Refill  . BREO ELLIPTA 200-25 MCG/INH AEPB TAKE 1 PUFF BY MOUTH EVERY DAY 60 each 0  . Cholecalciferol (VITAMIN D3) 2000 units capsule Take 1 capsule (2,000 Units total) by mouth daily. 100 capsule 3  . famotidine (PEPCID) 20 MG tablet ONE AT BEDTIME 90 tablet 0  . HYDROcodone-acetaminophen (NORCO) 5-325 MG tablet 1-2 tabs po q6 hours prn pain 40 tablet 0  . HYDROcodone-homatropine (HYCODAN) 5-1.5 MG/5ML syrup Take 5 mLs by mouth every 8 (eight) hours as needed for cough. 60 mL 0  . ibuprofen (ADVIL,MOTRIN) 600 MG tablet Take 1 tablet (600 mg total) by mouth every 8  (eight) hours as needed for moderate pain. 90 tablet 2  . tiZANidine (ZANAFLEX) 4 MG tablet TAKE 1 TAB BY MOUTH EVERY 6 HRS AS NEEDED FOR MUSCLE SPASMS. NEEDS OFFICE VISIT PER DOC 60 tablet 1   No facility-administered medications prior to visit.     Allergies  Allergen Reactions  . Tramadol Nausea Only    ROS Review of Systems  Constitutional: Negative for diaphoresis, fatigue, fever and unexpected weight change.  HENT: Positive for congestion, postnasal drip, sinus pressure and sinus pain. Negative for dental problem, ear pain, sore throat and voice change.   Eyes: Negative for photophobia.  Respiratory: Positive for cough. Negative for shortness of breath.   Cardiovascular: Negative.   Gastrointestinal: Negative.   Skin: Negative for pallor and rash.  Neurological: Positive for headaches. Negative for light-headedness and numbness.  Hematological: Negative.   Psychiatric/Behavioral: Positive for decreased concentration.      Objective:    Physical Exam  Constitutional: He is oriented to person, place, and time. He appears well-developed and well-nourished. No distress.  HENT:  Head: Normocephalic and atraumatic.    Right Ear: External ear normal.  Left Ear: External ear normal.  Mouth/Throat: Oropharynx is clear and moist. No oropharyngeal exudate.  Eyes: Pupils are equal, round, and reactive to light. Conjunctivae are normal. Right eye exhibits no discharge. Left eye exhibits no discharge. No scleral icterus.  Neck: No JVD present. No tracheal deviation present. No thyromegaly present.  Cardiovascular: Normal rate, regular rhythm and normal heart sounds.  Pulmonary/Chest: Effort normal and breath sounds normal. No stridor.  Lymphadenopathy:    He has no cervical adenopathy.  Neurological: He is alert and oriented to person, place, and time.  Skin: Skin is warm and dry. He is not diaphoretic.  Psychiatric: He has a normal mood and affect. His behavior is normal.     BP 124/80   Pulse 62   Temp 97.8 F (36.6 C) (Oral)   Ht 5\' 9"  (1.753 m)   Wt 192 lb 8 oz (87.3 kg)   SpO2 97%   BMI 28.43 kg/m  Wt Readings from Last 3 Encounters:  05/13/18 192 lb 8 oz (87.3 kg)  05/06/18 194 lb (88 kg)  04/22/18 193 lb 6 oz (87.7 kg)   BP Readings from Last 3 Encounters:  05/13/18 124/80  05/06/18 134/90  04/22/18 130/80   Guideline developer:  UpToDate (see UpToDate for funding source) Date Released: June 2014  Health Maintenance Due  Topic Date Due  . HIV Screening  07/28/1982  . COLONOSCOPY  07/27/2017  . INFLUENZA VACCINE  12/02/2017    There are no preventive care reminders to display for this patient.  Lab Results  Component Value Date   TSH 1.25 08/18/2016   Lab Results  Component Value Date   WBC 6.6 08/18/2016   HGB 16.0 08/18/2016   HCT 47.3 08/18/2016   MCV 88.2 08/18/2016   PLT 238.0 08/18/2016   Lab Results  Component Value Date   NA 139 08/18/2016   K 4.2 08/18/2016   CO2 29 08/18/2016   GLUCOSE 100 (H) 08/18/2016   BUN 20 08/18/2016   CREATININE 1.01 08/18/2016   BILITOT 0.5 08/18/2016   ALKPHOS 64 08/18/2016   AST 20 08/18/2016   ALT 26 08/18/2016   PROT 6.9 08/18/2016   ALBUMIN 4.7 08/18/2016   CALCIUM 9.8 08/18/2016   GFR 83.43 08/18/2016   Lab Results  Component Value Date   CHOL 217 (H) 05/22/2013   Lab Results  Component Value Date   HDL 60.10 05/22/2013   Lab Results  Component Value Date   LDLCALC 128 (H) 09/01/2011   Lab Results  Component Value Date   TRIG 48.0 05/22/2013   Lab Results  Component Value Date   CHOLHDL 4 05/22/2013   No results found for: HGBA1C    Assessment & Plan:   Problem List Items Addressed This Visit      Respiratory   Sinusitis - Primary   Relevant Medications   mometasone (NASONEX) 50 MCG/ACT nasal spray   Other Relevant Orders   DG Sinuses Complete     Other   Attention deficit      Meds ordered this encounter  Medications  . mometasone  (NASONEX) 50 MCG/ACT nasal spray    Sig: Two sprays in each nostril daily.    Dispense:  17 g    Refill:  1    Follow-up: Return in about 1 month (around 06/13/2018).   Sinus films today and a month-long trial of Nasonex.  Recommended psychiatry evaluation for his apparent attention deficit.  Told me that he would think about it and let me know.

## 2018-08-08 ENCOUNTER — Ambulatory Visit (INDEPENDENT_AMBULATORY_CARE_PROVIDER_SITE_OTHER): Payer: 59 | Admitting: Internal Medicine

## 2018-08-08 ENCOUNTER — Encounter: Payer: Self-pay | Admitting: Internal Medicine

## 2018-08-08 DIAGNOSIS — Z0000 Encounter for general adult medical examination without abnormal findings: Secondary | ICD-10-CM | POA: Diagnosis not present

## 2018-08-08 DIAGNOSIS — M25511 Pain in right shoulder: Secondary | ICD-10-CM

## 2018-08-08 DIAGNOSIS — K921 Melena: Secondary | ICD-10-CM | POA: Insufficient documentation

## 2018-08-08 DIAGNOSIS — R41840 Attention and concentration deficit: Secondary | ICD-10-CM | POA: Diagnosis not present

## 2018-08-08 MED ORDER — HYDROCORTISONE ACETATE 25 MG RE SUPP: 25.0000 mg | Freq: Two times a day (BID) | RECTAL | 0 refills | Status: AC

## 2018-08-08 MED ORDER — TRIAMCINOLONE ACETONIDE 0.1 % EX OINT: 1.0000 "application " | TOPICAL_OINTMENT | Freq: Two times a day (BID) | CUTANEOUS | 0 refills | Status: DC

## 2018-08-08 MED ORDER — HYDROCODONE-ACETAMINOPHEN 5-325 MG PO TABS: ORAL_TABLET | ORAL | 0 refills | Status: DC

## 2018-08-08 NOTE — Assessment & Plan Note (Signed)
Testing discussed

## 2018-08-08 NOTE — Assessment & Plan Note (Signed)
Normal bowel external/internal hemorrhoids.  Jeff's father was diagnosed with colon cancer at age of 64 or 49 Continue with Metamucil. Triamcinolone ointment after stools.  Continue to use wet wipes or washing Anusol HC suppositories at night We will reschedule his colonoscopy

## 2018-08-08 NOTE — Progress Notes (Signed)
Virtual Visit via Telephone Note  I connected with Zachary George on 08/08/18 at 10:20 AM EDT by telephone and verified that I am speaking with the correct person using two identifiers.   I discussed the limitations, risks, security and privacy concerns of performing an evaluation and management service by telephone and the availability of in person appointments. I also discussed with the patient that there may be a patient responsible charge related to this service. The patient expressed understanding and agreed to proceed.   History of Present Illness:   Zachary George is complaining of all blood on toilet paper when wiping with slight discomfort.  Possible external hemorrhoid.  Sometimes he sees drops of blood in the toilet.  His dad was diagnosed with colon cancer at the age of 44 3 years ago.  We made a referral for Barnabas Lister to have a colonoscopy last year, but it did not happen. Zachary George is using wet wipes to clean himself. He is complaining of chronic pain in the left shoulder. He is wondering about his attention and a possible ADD Observations/Objective: Zachary George is in no acute distress.  He points at painful shoulder  with range of motion  Assessment and Plan: See plan  Follow Up Instructions:    I discussed the assessment and treatment plan with the patient. The patient was provided an opportunity to ask questions and all were answered. The patient agreed with the plan and demonstrated an understanding of the instructions.   The patient was advised to call back or seek an in-person evaluation if the symptoms worsen or if the condition fails to improve as anticipated.  I provided 20 minutes of non-face-to-face time during this encounter.   Walker Kehr, MD

## 2018-08-08 NOTE — Assessment & Plan Note (Signed)
Norco prn - rare use  Potential benefits of a long term opioids use as well as potential risks (i.e. addiction risk, apnea etc) and complications (i.e. Somnolence, constipation and others) were explained to the patient and were aknowledged.

## 2018-08-15 ENCOUNTER — Other Ambulatory Visit: Payer: Self-pay

## 2018-08-15 ENCOUNTER — Encounter: Payer: Self-pay | Admitting: Internal Medicine

## 2018-08-15 ENCOUNTER — Ambulatory Visit (INDEPENDENT_AMBULATORY_CARE_PROVIDER_SITE_OTHER): Payer: 59 | Admitting: Internal Medicine

## 2018-08-15 DIAGNOSIS — K6289 Other specified diseases of anus and rectum: Secondary | ICD-10-CM

## 2018-08-15 DIAGNOSIS — Z1211 Encounter for screening for malignant neoplasm of colon: Secondary | ICD-10-CM | POA: Diagnosis not present

## 2018-08-15 DIAGNOSIS — K625 Hemorrhage of anus and rectum: Secondary | ICD-10-CM | POA: Diagnosis not present

## 2018-08-15 DIAGNOSIS — Z800 Family history of malignant neoplasm of digestive organs: Secondary | ICD-10-CM | POA: Diagnosis not present

## 2018-08-15 NOTE — Progress Notes (Signed)
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 Alain Marion regarding rectal bleeding.  Patient reports personal history of irritable bowel syndrome for which he underwent flexible sigmoidoscopy in his mid 51s.  Over the years he has noticed minor bright red blood per rectum on the tissue attributable to hemorrhoids.  Over the past 3 to 4 weeks this is been more persistent.  As well rectal discomfort with protruding entity felt to be a hemorrhoid.  He was evaluated by Dr. Alain Marion via telemedicine August 08, 2018.  He has been on Metamucil and is using hydrocortisone ointment.  He did not pick up medicated suppositories due to cost.  There is a family history of colon cancer in his father age 65.  Patient has not had colonoscopy.  His GI review of systems is otherwise negative.  In particular, no change in bowel habits, abdominal pain, or weight loss.  This visit today is via telemedicine WebEx due to the coronavirus pandemic.  REVIEW OF SYSTEMS:  All non-GI ROS negative unless otherwise stated in the HPI except for shoulder pain  Past Medical History:  Diagnosis Date  . Allergic rhinitis, cause unspecified 09/05/2013  . IBS (irritable bowel syndrome)   . Left elbow pain 2011   tennis elbow    Past Surgical History:  Procedure Laterality Date  . LIGAMENT REPAIR Right 04/22/2017   Procedure: RIGHT THUMB REPAIR;  Surgeon: Leanora Cover, MD;  Location: [facility];  Service: Orthopedics;  Laterality: Right;  . ULNAR COLLATERAL LIGAMENT REPAIR Right 04/22/2017   Procedure: RADIAL AND ULNAR COLLATERAL LIGAMENT REPAIR;  Surgeon: Leanora Cover, MD;  Location: [facility];  Service: Orthopedics;  Laterality: Right;    Social History Velton Manka  reports that he has never smoked. He has never used smokeless tobacco. He reports current alcohol use. He reports that he does not use drugs.  family history includes Hypertension in his  father.  Allergies  Allergen Reactions  . Tramadol Nausea Only       PHYSICAL EXAMINATION: No physical exam as this is a telemedicine visit    ASSESSMENT:  1.  Intermittent minor rectal bleeding likely due to hemorrhoid 2.  Intermittent rectal discomfort with protruding lesion likely hemorrhoid 3.  Family history of colon cancer in his father at age 59.  Patient has not had screening colonoscopy   PLAN:  1.  Colonoscopy in the [place] with me next available session to evaluate rectal bleeding and a high risk patient (family history) and rule out colon cancer.The nature of the procedure, as well as the risks, benefits, and alternatives were carefully and thoroughly reviewed with the patient. Ample time for discussion and questions allowed. The patient understood, was satisfied, and agreed to proceed. 2.  If colonoscopy demonstrates hemorrhoids as the source for discomfort and bleeding, then review treatment options at that time.  This visit was with the patient who was identified at at home and myself in my office.  The patient initiated the visit and agreed to the visit.  He understands there may be an associated charge.

## 2018-08-16 ENCOUNTER — Telehealth: Payer: Self-pay | Admitting: *Deleted

## 2018-08-16 MED ORDER — SUPREP BOWEL PREP KIT 17.5-3.13-1.6 GM/177ML PO SOLN: 1.0000 | ORAL | 0 refills | Status: DC

## 2018-08-16 NOTE — Addendum Note (Signed)
Addended by: Larina Bras on: 08/16/2018 11:18 AM   Modules accepted: Orders

## 2018-08-16 NOTE — Patient Instructions (Signed)

## 2018-08-16 NOTE — Telephone Encounter (Signed)
Pt returned your call.  

## 2018-08-16 NOTE — Telephone Encounter (Signed)
I have left a message for patient to call back. Patient has been scheduled for colonoscopy with Dr Henrene Pastor at [facility] on 08/25/2018 at 330 pm using Suprep. I need to go over prep instructions with patient.

## 2018-08-17 NOTE — Telephone Encounter (Signed)
I have spoken to patient in great detail regarding colonoscopy prep, date, time and location (30+ minute conversation). I have also advised he will need a care partner 51 years of age or older to bring him, stay and then take him home since he will be sedated for procedure. He verbalizes understanding of all of this information. I have also sent written instruction packet to patient home along with patient acknowledgement form and have asked that patient initial and sign form and mail back in our pre-stamped envelope prior to his appointment.

## 2018-08-24 ENCOUNTER — Telehealth: Payer: Self-pay | Admitting: *Deleted

## 2018-08-24 NOTE — Telephone Encounter (Signed)
Attempted to reach pt for COVID screening calls.  LMOM

## 2018-08-24 NOTE — Telephone Encounter (Signed)
LMOM regarding arrival time for procedure tomorrow and that his care partner will stay in the car during the procedure.  I asked him to call our office is he has traveled in that 14 days, had a fever or any respiratory problem in the last 14 days or had any family members or close contacts dx'd with the COVID 19

## 2018-08-24 NOTE — Telephone Encounter (Signed)
Patient called again.. he states you guys are playing phone tag

## 2018-08-24 NOTE — Telephone Encounter (Signed)
Pt returned your call, pls try again.

## 2018-08-25 ENCOUNTER — Other Ambulatory Visit: Payer: Self-pay

## 2018-08-25 ENCOUNTER — Ambulatory Visit (AMBULATORY_SURGERY_CENTER): Payer: 59 | Admitting: Internal Medicine

## 2018-08-25 ENCOUNTER — Encounter: Payer: Self-pay | Admitting: Internal Medicine

## 2018-08-25 VITALS — BP 128/81 | HR 72 | Temp 97.7°F | Resp 20 | Ht 69.0 in | Wt 192.0 lb

## 2018-08-25 DIAGNOSIS — Z1211 Encounter for screening for malignant neoplasm of colon: Secondary | ICD-10-CM

## 2018-08-25 DIAGNOSIS — Z800 Family history of malignant neoplasm of digestive organs: Secondary | ICD-10-CM

## 2018-08-25 DIAGNOSIS — D125 Benign neoplasm of sigmoid colon: Secondary | ICD-10-CM

## 2018-08-25 DIAGNOSIS — D123 Benign neoplasm of transverse colon: Secondary | ICD-10-CM

## 2018-08-25 DIAGNOSIS — K635 Polyp of colon: Secondary | ICD-10-CM

## 2018-08-25 DIAGNOSIS — K625 Hemorrhage of anus and rectum: Secondary | ICD-10-CM

## 2018-08-25 MED ORDER — SODIUM CHLORIDE 0.9 % IV SOLN: 500.0000 mL | Freq: Once | INTRAVENOUS | Status: DC

## 2018-08-25 NOTE — Patient Instructions (Signed)
Handouts Provided:  Polyps  YOU HAD AN ENDOSCOPIC PROCEDURE TODAY AT THE [facility]:   Refer to the procedure report that was given to you for any specific questions about what was found during the examination.  If the procedure report does not answer your questions, please call your gastroenterologist to clarify.  If you requested that your care partner not be given the details of your procedure findings, then the procedure report has been included in a sealed envelope for you to review at your convenience later.  YOU SHOULD EXPECT: Some feelings of bloating in the abdomen. Passage of more gas than usual.  Walking can help get rid of the air that was put into your GI tract during the procedure and reduce the bloating. If you had a lower endoscopy (such as a colonoscopy or flexible sigmoidoscopy) you may notice spotting of blood in your stool or on the toilet paper. If you underwent a bowel prep for your procedure, you may not have a normal bowel movement for a few days.  Please Note:  You might notice some irritation and congestion in your nose or some drainage.  This is from the oxygen used during your procedure.  There is no need for concern and it should clear up in a day or so.  SYMPTOMS TO REPORT IMMEDIATELY:   Following lower endoscopy (colonoscopy or flexible sigmoidoscopy):  Excessive amounts of blood in the stool  Significant tenderness or worsening of abdominal pains  Swelling of the abdomen that is new, acute  Fever of 100F or higher  For urgent or emergent issues, a gastroenterologist can be reached at any hour by calling [phone].   DIET:  We do recommend a small meal at first, but then you may proceed to your regular diet.  Drink plenty of fluids but you should avoid alcoholic beverages for 24 hours.  ACTIVITY:  You should plan to take it easy for the rest of today and you should NOT DRIVE or use heavy machinery until tomorrow (because of the sedation  medicines used during the test).    FOLLOW UP: Our staff will call the number listed on your records the next business day following your procedure to check on you and address any questions or concerns that you may have regarding the information given to you following your procedure. If we do not reach you, we will leave a message.  However, if you are feeling well and you are not experiencing any problems, there is no need to return our call.  We will assume that you have returned to your regular daily activities without incident.  If any biopsies were taken you will be contacted by phone or by letter within the next 1-3 weeks.  Please call us at [phone] if you have not heard about the biopsies in 3 weeks.    SIGNATURES/CONFIDENTIALITY: You and/or your care partner have signed paperwork which will be entered into your electronic medical record.  These signatures attest to the fact that that the information above on your After Visit Summary has been reviewed and is understood.  Full responsibility of the confidentiality of this discharge information lies with you and/or your care-partner.  

## 2018-08-25 NOTE — Progress Notes (Signed)
Report given to PACU, vss 

## 2018-08-25 NOTE — Progress Notes (Signed)
Called to room to assist during endoscopic procedure.  Patient ID and intended procedure confirmed with present staff. Received instructions for my participation in the procedure from the performing physician.  

## 2018-08-25 NOTE — Op Note (Signed)
[name] Procedure Date: 08/25/2018 3:33 PM MRN: [id number] Endoscopist: Docia Chuck. Zachary George , MD Age: 53 Referring MD:  Date of Birth: 22-Oct-1965 Gender: Male Account #: [id number] Procedure:                Colonoscopy with cold and hot snare polypectomies                            each x 1 Indications:              Screening for colorectal malignant neoplasm.                            Incidental minor rectal bleeding Medicines:                Monitored Anesthesia Care Procedure:                Pre-Anesthesia Assessment:                           - Prior to the procedure, a History and Physical                            was performed, and patient medications and                            allergies were reviewed. The patient's tolerance of                            previous anesthesia was also reviewed. The risks                            and benefits of the procedure and the sedation                            options and risks were discussed with the patient.                            All questions were answered, and informed consent                            was obtained. Prior Anticoagulants: The patient has                            taken no previous anticoagulant or antiplatelet                            agents. ASA Grade Assessment: II - A patient with                            mild systemic disease. After reviewing the risks                            and benefits, the patient was deemed in  satisfactory condition to undergo the procedure.                           After obtaining informed consent, the colonoscope                            was passed under direct vision. Throughout the                            procedure, the patient's blood pressure, pulse, and                            oxygen saturations were monitored continuously. The                            Colonoscope was introduced through the  anus and                            advanced to the the cecum, identified by                            appendiceal orifice and ileocecal valve. The                            ileocecal valve, appendiceal orifice, and rectum                            were photographed. The quality of the bowel                            preparation was excellent. The colonoscopy was                            performed without difficulty. The patient tolerated                            the procedure well. The bowel preparation used was                            SUPREP via split dose instruction. Scope In: 3:38:18 PM Scope Out: 3:54:28 PM Scope Withdrawal Time: 0 hours 13 minutes 55 seconds  Total Procedure Duration: 0 hours 16 minutes 10 seconds  Findings:                 A 12 mm polyp was found in the sigmoid colon. The                            polyp was pedunculated. The polyp was removed with                            a hot snare. Resection and retrieval were complete.                           A 4 mm polyp was found in  the transverse colon. The                            polyp was sessile. The polyp was removed with a                            cold snare. Resection and retrieval were complete.                           The exam was otherwise without abnormality on                            direct and retroflexion views. Complications:            No immediate complications. Estimated blood loss:                            None. Estimated Blood Loss:     Estimated blood loss: none. Impression:               - One 12 mm polyp in the sigmoid colon, removed                            with a hot snare. Resected and retrieved.                           - One 4 mm polyp in the transverse colon, removed                            with a cold snare. Resected and retrieved.                           - The examination was otherwise normal on direct                            and retroflexion  views. Recommendation:           - Repeat colonoscopy in 3 years for surveillance if                            polyp is nonmalignant.                           - Patient has a contact number available for                            emergencies. The signs and symptoms of potential                            delayed complications were discussed with the                            patient. Return to normal activities tomorrow.  Written discharge instructions were provided to the                            patient.                           - Resume previous diet.                           - Continue present medications.                           - Await pathology results. Docia Chuck. Zachary Pastor, MD 08/25/2018 4:08:40 PM This report has been signed electronically.

## 2018-08-26 ENCOUNTER — Telehealth: Payer: Self-pay | Admitting: *Deleted

## 2018-08-26 NOTE — Telephone Encounter (Signed)
  Follow up Call-  Call back number 08/25/2018  Post procedure Call Back phone  # [phone]  Permission to leave phone message Yes  Some recent data might be hidden    [facility]

## 2018-08-31 ENCOUNTER — Encounter: Payer: Self-pay | Admitting: Internal Medicine

## 2018-11-11 ENCOUNTER — Other Ambulatory Visit: Payer: Self-pay | Admitting: Internal Medicine

## 2018-11-11 NOTE — Telephone Encounter (Signed)
Pt would like refill on hydrocodone. [facility] [address]

## 2018-11-11 NOTE — Telephone Encounter (Signed)
Pt has appt Monday, please advise

## 2018-11-14 ENCOUNTER — Other Ambulatory Visit: Payer: Self-pay

## 2018-11-14 ENCOUNTER — Encounter: Payer: Self-pay | Admitting: Internal Medicine

## 2018-11-14 ENCOUNTER — Ambulatory Visit (INDEPENDENT_AMBULATORY_CARE_PROVIDER_SITE_OTHER): Payer: 59 | Admitting: Internal Medicine

## 2018-11-14 DIAGNOSIS — H9202 Otalgia, left ear: Secondary | ICD-10-CM | POA: Diagnosis not present

## 2018-11-14 DIAGNOSIS — M25511 Pain in right shoulder: Secondary | ICD-10-CM

## 2018-11-14 DIAGNOSIS — K635 Polyp of colon: Secondary | ICD-10-CM | POA: Diagnosis not present

## 2018-11-14 MED ORDER — HYDROCODONE-ACETAMINOPHEN 5-325 MG PO TABS: ORAL_TABLET | ORAL | 0 refills | Status: DC

## 2018-11-14 MED ORDER — BREO ELLIPTA 200-25 MCG/INH IN AEPB: 1.0000 | INHALATION_SPRAY | Freq: Every morning | RESPIRATORY_TRACT | 11 refills | Status: DC

## 2018-11-14 MED ORDER — TIZANIDINE HCL 4 MG PO TABS: 4.0000 mg | ORAL_TABLET | Freq: Three times a day (TID) | ORAL | 3 refills | Status: DC | PRN

## 2018-11-14 NOTE — Assessment & Plan Note (Signed)
  Norco prn - rare use  Potential benefits of a long term opioids use as well as potential risks (i.e. addiction risk, apnea etc) and complications (i.e. Somnolence, constipation and others) were explained to the patient and were aknowledged.

## 2018-11-14 NOTE — Assessment & Plan Note (Addendum)
F/u w/oral surgery - Dr Olena Heckle Nl ear exam?TMJ - seeing a dentist - inflamed tendon near TMJ - he had an expensive CT. He was advised NSAIDs

## 2018-11-14 NOTE — Progress Notes (Signed)
Subjective:  Patient ID: Zachary George, male    DOB: Jun 26, 1965  Age: 53 y.o. MRN: [id number]  CC: No chief complaint on file.   HPI Zachary George presents for L ear pain x weeks - ?TMJ - seeing a dentist - inflamed tendon near TMJ - he had an expensive CT. He was advised NSAIDs. F/u shoulder pain and colon polyp.   Outpatient Medications Prior to Visit  Medication Sig Dispense Refill  . BREO ELLIPTA 200-25 MCG/INH AEPB TAKE 1 PUFF BY MOUTH EVERY DAY 60 each 0  . Cholecalciferol (VITAMIN D3) 2000 units capsule Take 1 capsule (2,000 Units total) by mouth daily. 100 capsule 3  . HYDROcodone-acetaminophen (NORCO) 5-325 MG tablet 1 tabs po q6 hours prn pain 40 tablet 0  . hydrocortisone (ANUSOL-HC) 25 MG suppository Place 1 suppository (25 mg total) rectally 2 (two) times daily. 20 suppository 0  . ibuprofen (ADVIL,MOTRIN) 600 MG tablet Take 1 tablet (600 mg total) by mouth every 8 (eight) hours as needed for moderate pain. 90 tablet 2  . mometasone (NASONEX) 50 MCG/ACT nasal spray Two sprays in each nostril daily. 17 g 1  . tiZANidine (ZANAFLEX) 4 MG tablet TAKE 1 TAB BY MOUTH EVERY 6 HRS AS NEEDED FOR MUSCLE SPASMS. NEEDS OFFICE VISIT PER DOC 60 tablet 1  . triamcinolone ointment (KENALOG) 0.1 % Apply 1 application topically 2 (two) times daily. 80 g 0   No facility-administered medications prior to visit.     ROS: Review of Systems  Constitutional: Negative for appetite change, fatigue and unexpected weight change.  HENT: Positive for ear pain. Negative for congestion, dental problem, nosebleeds, sneezing, sore throat and trouble swallowing.   Eyes: Negative for itching and visual disturbance.  Respiratory: Negative for cough.   Cardiovascular: Negative for chest pain, palpitations and leg swelling.  Gastrointestinal: Negative for abdominal distention, blood in stool, diarrhea and nausea.  Genitourinary: Negative for frequency and hematuria.  Musculoskeletal: Negative for back pain,  gait problem, joint swelling and neck pain.  Skin: Negative for rash.  Neurological: Negative for dizziness, tremors, speech difficulty and weakness.  Psychiatric/Behavioral: Negative for agitation, dysphoric mood, sleep disturbance and suicidal ideas. The patient is not nervous/anxious.     Objective:  BP 126/78 (BP Location: Left Arm, Patient Position: Sitting, Cuff Size: Large)   Pulse 67   Temp 98.1 F (36.7 C) (Oral)   Ht 5\' 9"  (1.753 m)   Wt 192 lb (87.1 kg)   SpO2 98%   BMI 28.35 kg/m   BP Readings from Last 3 Encounters:  11/14/18 126/78  08/25/18 128/81  05/13/18 124/80    Wt Readings from Last 3 Encounters:  11/14/18 192 lb (87.1 kg)  08/25/18 192 lb (87.1 kg)  05/13/18 192 lb 8 oz (87.3 kg)    Physical Exam Constitutional:      General: He is not in acute distress.    Appearance: He is well-developed.     Comments: NAD  HENT:     Right Ear: Tympanic membrane, ear canal and external ear normal. There is impacted cerumen.     Left Ear: Tympanic membrane, ear canal and external ear normal. There is no impacted cerumen.     Nose: Nose normal.     Mouth/Throat:     Mouth: Mucous membranes are moist.  Eyes:     Conjunctiva/sclera: Conjunctivae normal.     Pupils: Pupils are equal, round, and reactive to light.  Neck:     Musculoskeletal: Normal range of motion.  Thyroid: No thyromegaly.     Vascular: No JVD.  Cardiovascular:     Rate and Rhythm: Normal rate and regular rhythm.     Heart sounds: Normal heart sounds. No murmur. No friction rub. No gallop.   Pulmonary:     Effort: Pulmonary effort is normal. No respiratory distress.     Breath sounds: Normal breath sounds. No wheezing or rales.  Chest:     Chest wall: No tenderness.  Abdominal:     General: Bowel sounds are normal. There is no distension.     Palpations: Abdomen is soft. There is no mass.     Tenderness: There is no abdominal tenderness. There is no guarding or rebound.   Musculoskeletal: Normal range of motion.        General: No tenderness.  Lymphadenopathy:     Cervical: No cervical adenopathy.  Skin:    General: Skin is warm and dry.     Findings: No rash.  Neurological:     Mental Status: He is alert and oriented to person, place, and time.     Cranial Nerves: No cranial nerve deficit.     Motor: No abnormal muscle tone.     Coordination: Coordination normal.     Gait: Gait normal.     Deep Tendon Reflexes: Reflexes are normal and symmetric.  Psychiatric:        Behavior: Behavior normal.        Thought Content: Thought content normal.        Judgment: Judgment normal.   L ear looks normal  Lab Results  Component Value Date   WBC 6.6 08/18/2016   HGB 16.0 08/18/2016   HCT 47.3 08/18/2016   PLT 238.0 08/18/2016   GLUCOSE 100 (H) 08/18/2016   CHOL 217 (H) 05/22/2013   TRIG 48.0 05/22/2013   HDL 60.10 05/22/2013   LDLDIRECT 156.0 05/22/2013   LDLCALC 128 (H) 09/01/2011   ALT 26 08/18/2016   AST 20 08/18/2016   NA 139 08/18/2016   K 4.2 08/18/2016   CL 104 08/18/2016   CREATININE 1.01 08/18/2016   BUN 20 08/18/2016   CO2 29 08/18/2016   TSH 1.25 08/18/2016   PSA 1.45 09/01/2011    No results found.  Assessment & Plan:   There are no diagnoses linked to this encounter.   No orders of the defined types were placed in this encounter.    Follow-up: No follow-ups on file.  Walker Kehr, MD

## 2018-11-14 NOTE — Assessment & Plan Note (Signed)
Due colon 2023

## 2018-11-15 ENCOUNTER — Emergency Department (HOSPITAL_BASED_OUTPATIENT_CLINIC_OR_DEPARTMENT_OTHER)
Admission: EM | Admit: 2018-11-15 | Discharge: 2018-11-15 | Disposition: A | Discharge status: Home / Self Care | Payer: 59 | Attending: Emergency Medicine | Admitting: Emergency Medicine

## 2018-11-15 ENCOUNTER — Other Ambulatory Visit: Payer: Self-pay

## 2018-11-15 ENCOUNTER — Emergency Department (HOSPITAL_BASED_OUTPATIENT_CLINIC_OR_DEPARTMENT_OTHER): Payer: 59

## 2018-11-15 ENCOUNTER — Encounter (HOSPITAL_BASED_OUTPATIENT_CLINIC_OR_DEPARTMENT_OTHER): Payer: Self-pay | Admitting: *Deleted

## 2018-11-15 DIAGNOSIS — Y999 Unspecified external cause status: Secondary | ICD-10-CM | POA: Diagnosis not present

## 2018-11-15 DIAGNOSIS — S61224A Laceration with foreign body of right ring finger without damage to nail, initial encounter: Secondary | ICD-10-CM | POA: Insufficient documentation

## 2018-11-15 DIAGNOSIS — Y929 Unspecified place or not applicable: Secondary | ICD-10-CM | POA: Diagnosis not present

## 2018-11-15 DIAGNOSIS — W268XXA Contact with other sharp object(s), not elsewhere classified, initial encounter: Secondary | ICD-10-CM | POA: Diagnosis not present

## 2018-11-15 DIAGNOSIS — Y939 Activity, unspecified: Secondary | ICD-10-CM | POA: Diagnosis not present

## 2018-11-15 DIAGNOSIS — S6990XA Unspecified injury of unspecified wrist, hand and finger(s), initial encounter: Secondary | ICD-10-CM

## 2018-11-15 MED ORDER — LIDOCAINE HCL (PF) 1 % IJ SOLN
10.0000 mL | Freq: Once | INTRAMUSCULAR | Status: AC
  Administered 2018-11-15: 10 mL
  Filled 2018-11-15: qty 10

## 2018-11-15 MED ORDER — CEPHALEXIN 500 MG PO CAPS: 500.0000 mg | ORAL_CAPSULE | Freq: Four times a day (QID) | ORAL | 0 refills | Status: DC

## 2018-11-15 NOTE — ED Notes (Signed)
Pt ambulatory to XR at this time

## 2018-11-15 NOTE — Discharge Instructions (Signed)
Please take antibiotics as prescribed You will need to remove the suture in 5 days time Follow up with Dr. Fredna Dow as needed

## 2018-11-15 NOTE — ED Triage Notes (Signed)
Pt presents with a fishing hook in his right ring finger
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
## 2018-12-07 ENCOUNTER — Other Ambulatory Visit: Payer: Self-pay | Admitting: Internal Medicine

## 2019-02-13 ENCOUNTER — Other Ambulatory Visit: Payer: Self-pay | Admitting: Internal Medicine

## 2019-02-13 NOTE — Telephone Encounter (Signed)
Medication Refill - Medication: HYDROcodone-acetaminophen (NORCO) 5-325 MG tablet    Has the patient contacted their pharmacy? Yes.   (Agent: If no, request that the patient contact the pharmacy for the refill.) (Agent: If yes, when and what did the pharmacy advise?)  Preferred Pharmacy (with phone number or street name):  [id number] [address], [place]  [place] [address]  Phone: [phone] Fax: [phone]     Agent: Please be advised that RX refills may take up to 3 business days. We ask that you follow-up with your pharmacy.

## 2019-02-13 NOTE — Telephone Encounter (Signed)
Requested medication (s) are due for refill today: yes  Requested medication (s) are on the active medication list: yes  Last refill: 08/08/2018  Future visit scheduled: no  Notes to clinic: refill cannot be delegated    Requested Prescriptions  Pending Prescriptions Disp Refills   HYDROcodone-acetaminophen (NORCO) 5-325 MG tablet 40 tablet 0    Sig: 1 tabs po q6 hours prn pain     Not Delegated - Analgesics:  Opioid Agonist Combinations Failed - 02/13/2019  1:08 PM      Failed - This refill cannot be delegated      Failed - Urine Drug Screen completed in last 360 days.      Passed - Valid encounter within last 6 months    Recent Outpatient Visits          3 months ago Left ear pain   [place], MD   6 months ago Blood in stool   [place], MD   9 months ago Sinusitis, unspecified chronicity, unspecified location   [facility] Ethelene Hal, Mortimer Fries, MD   9 months ago Acute non-recurrent sphenoidal sinusitis   [facility] -[facility] Knack, MD   9 months ago Acute non-recurrent sphenoidal sinusitis   [facility] Ethelene Hal, Mortimer Fries, MD

## 2019-02-13 NOTE — Telephone Encounter (Signed)
[place] Controlled Database Checked Last filled: 11/14/18 # 40 LOV w/you:11/14/18 Next appt w/you: None

## 2019-02-17 MED ORDER — HYDROCODONE-ACETAMINOPHEN 5-325 MG PO TABS: ORAL_TABLET | ORAL | 0 refills | Status: DC

## 2019-02-17 NOTE — Telephone Encounter (Signed)
Patient is calling to check on the status of this medication. Please advise

## 2019-03-16 ENCOUNTER — Ambulatory Visit (INDEPENDENT_AMBULATORY_CARE_PROVIDER_SITE_OTHER): Payer: 59 | Admitting: Internal Medicine

## 2019-03-16 ENCOUNTER — Encounter: Payer: Self-pay | Admitting: Internal Medicine

## 2019-03-16 DIAGNOSIS — G43809 Other migraine, not intractable, without status migrainosus: Secondary | ICD-10-CM | POA: Diagnosis not present

## 2019-03-16 DIAGNOSIS — J320 Chronic maxillary sinusitis: Secondary | ICD-10-CM | POA: Diagnosis not present

## 2019-03-16 DIAGNOSIS — J209 Acute bronchitis, unspecified: Secondary | ICD-10-CM

## 2019-03-16 MED ORDER — METHYLPREDNISOLONE 4 MG PO TBPK: ORAL_TABLET | ORAL | 0 refills | Status: DC

## 2019-03-16 MED ORDER — AZITHROMYCIN 250 MG PO TABS: ORAL_TABLET | ORAL | 0 refills | Status: DC

## 2019-03-16 NOTE — Progress Notes (Signed)
Virtual Visit via Video Note  I connected with Zachary George on 03/16/19 at 11:20 AM EST by a video enabled telemedicine application and verified that I am speaking with the correct person using two identifiers.   I discussed the limitations of evaluation and management by telemedicine and the availability of in person appointments. The patient expressed understanding and agreed to proceed.  History of Present Illness:  Zachary George is complaining of sinus congestion, headaches, cough has been getting worse over past 2 weeks.  He tried Memory Dance, Sudafed over-the-counter cold medicines with no relief. There has been noabdominal pain, diarrhea, constipation, arthralgias, skin rashes.   Observations/Objective: The patient appears to be in no acute distress, looks well.  Assessment and Plan:  See my Assessment and Plan. Follow Up Instructions:    I discussed the assessment and treatment plan with the patient. The patient was provided an opportunity to ask questions and all were answered. The patient agreed with the plan and demonstrated an understanding of the instructions.   The patient was advised to call back or seek an in-person evaluation if the symptoms worsen or if the condition fails to improve as anticipated.  I provided face-to-face time during this encounter. We were at different locations.   Walker Kehr, MD

## 2019-03-16 NOTE — Assessment & Plan Note (Signed)
Continue with Memory Dance

## 2019-03-16 NOTE — Assessment & Plan Note (Signed)
Z-Pak.  Medrol Dosepak.  Continue with over-the-counter meds as needed.  Call if not better

## 2019-03-16 NOTE — Assessment & Plan Note (Addendum)
Worse. Ibuprofen 600 mg p.o. 3 times daily PC as needed Treat sinusitis

## 2019-03-17 ENCOUNTER — Ambulatory Visit: Payer: 59 | Admitting: Family Medicine

## 2019-03-21 ENCOUNTER — Other Ambulatory Visit: Payer: Self-pay | Admitting: Internal Medicine

## 2019-04-04 ENCOUNTER — Ambulatory Visit: Payer: Self-pay

## 2019-04-04 ENCOUNTER — Encounter: Payer: Self-pay | Admitting: Family Medicine

## 2019-04-04 ENCOUNTER — Ambulatory Visit (INDEPENDENT_AMBULATORY_CARE_PROVIDER_SITE_OTHER): Payer: 59 | Admitting: Family Medicine

## 2019-04-04 ENCOUNTER — Other Ambulatory Visit: Payer: Self-pay | Admitting: Internal Medicine

## 2019-04-04 ENCOUNTER — Other Ambulatory Visit: Payer: Self-pay

## 2019-04-04 VITALS — BP 142/88 | HR 87 | Ht 69.0 in | Wt 192.4 lb

## 2019-04-04 DIAGNOSIS — M7711 Lateral epicondylitis, right elbow: Secondary | ICD-10-CM | POA: Diagnosis not present

## 2019-04-04 DIAGNOSIS — M25521 Pain in right elbow: Secondary | ICD-10-CM | POA: Diagnosis not present

## 2019-04-04 NOTE — Progress Notes (Signed)
I, Wendy Poet, LAT, ATC, am serving as scribe for Dr. Lynne Leader.  Zachary George is a 53 y.o. male (RHD) who presents to [facility] today for c/o R elbow pain x 3 weeks w/ no known MOI.  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  Pt notes pain and swelling at his R lateral elbow.  He notes radiating pain into the R forearm and into his R hand.  He notes numbness/tingling in his R forearm and hand.  Pt rates his pain at a sharp 8/10.  He states that the pain wakes him up at night.  He has tried Tylenol, Advil, topical Theraworx w/ no significant relief.    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  He has been doing a lot of fishing and yard work.  He notes that when he is practicing with his cards for his [employer] job he does not have a lot of pain but when he is doing anything heavy duty such as fishing or yard work he has more pain.  Pain is located the lateral elbow.   ROS:  As above  Exam:  BP (!) 142/88 (BP Location: Left Arm, Patient Position: Sitting, Cuff Size: Normal)   Pulse 87   Ht 5\' 9"  (1.753 m)   Wt 192 lb 6.4 oz (87.3 kg)   SpO2 97%   BMI 28.41 kg/m  Wt Readings from Last 5 Encounters:  04/04/19 192 lb 6.4 oz (87.3 kg)  11/15/18 192 lb (87.1 kg)  11/14/18 192 lb (87.1 kg)  08/25/18 192 lb (87.1 kg)  05/13/18 192 lb 8 oz (87.3 kg)   General: Well Developed, well nourished, and in no acute distress.  Neuro/Psych: Alert and oriented x3, extra-ocular muscles intact, able to move all 4 extremities, sensation grossly intact. Skin: Warm and dry, no rashes noted.  Respiratory: Not using accessory muscles, speaking in full sentences, trachea midline.  Cardiovascular: Pulses palpable, no extremity edema. Abdomen: Does not appear distended. MSK:  Right elbow normal-appearing normal motion. Tender palpation lateral  epicondyle.  Pain with resisted wrist extension present.  Pulses cap refill sensation are intact distally.    Lab and Radiology Results Procedure: Real-time Ultrasound Guided Injection of right lateral epicondyle Device: Philips Affiniti 50G Images permanently stored and available for review in the ultrasound unit. Verbal informed consent obtained.  Discussed risks and benefits of procedure. Warned about infection bleeding damage to structures skin hypopigmentation and fat atrophy among others. Patient expresses understanding and agreement Time-out conducted.   Noted no overlying erythema, induration, or other signs of local infection.   Skin prepped in a sterile fashion.   Local anesthesia: Topical Ethyl chloride.   With sterile technique and under real time ultrasound guidance:  40 mg of Kenalog and 1 mL of Marcaine injected easily.   Completed without difficulty   Pain immediately resolved suggesting accurate placement of the medication.   Advised to call if fevers/chills, erythema, induration, drainage, or persistent bleeding.   Images permanently stored and available for review in the ultrasound unit.  Impression: Technically successful ultrasound guided injection.         Assessment and Plan: 53 y.o. male with right lateral elbow pain due to lateral epicondylitis.  Plan to treat with injection as above, diclofenac gel and home exercise program.  Additionally recommend use of counterforce brace.  Recheck as needed.  Precautions reviewed.  Stressed the importance of dedicated home exercises to prevent recurrence.   PDMP not reviewed this encounter. Orders Placed This Encounter  Procedures  . NO CHG - [place] UPPER RIGHT    Order Specific Question:   Reason for Exam (SYMPTOM  OR DIAGNOSIS REQUIRED)    Answer:   R elbow pain    Order Specific Question:   Preferred imaging location?    Answer:   [facility]   No orders of the defined types were placed in this  encounter.   Historical information moved to improve visibility of documentation.  Past Medical History:  Diagnosis Date  . Allergic rhinitis, cause unspecified 09/05/2013  . Allergy   . GERD (gastroesophageal reflux disease)   . Glaucoma   . IBS (irritable bowel syndrome)   . Left elbow pain 2011   tennis elbow   Past Surgical History:  Procedure Laterality Date  . LIGAMENT REPAIR Right 04/22/2017   Procedure: RIGHT THUMB REPAIR;  Surgeon: Leanora Cover, MD;  Location: [facility];  Service: Orthopedics;  Laterality: Right;  . ULNAR COLLATERAL LIGAMENT REPAIR Right 04/22/2017   Procedure: RADIAL AND ULNAR COLLATERAL LIGAMENT REPAIR;  Surgeon: Leanora Cover, MD;  Location: [facility];  Service: Orthopedics;  Laterality: Right;   Social History   Tobacco Use  . Smoking status: Never Smoker  . Smokeless tobacco: Never Used  Substance Use Topics  . Alcohol use: Yes    Alcohol/week: 0.0 standard drinks    Comment: rare   family history includes Colon cancer in his father; Hypertension in his father.  Medications: Current Outpatient Medications  Medication Sig Dispense Refill  . azithromycin (ZITHROMAX Z-PAK) 250 MG tablet As directed 6 tablet 0  . cephALEXin (KEFLEX) 500 MG capsule Take 1 capsule (500 mg total) by mouth 4 (four) times daily. 20 capsule 0  . Cholecalciferol (VITAMIN D3) 2000 units capsule Take 1 capsule (2,000 Units total) by mouth daily. 100 capsule 3  . fluticasone furoate-vilanterol (BREO ELLIPTA) 200-25 MCG/INH AEPB Inhale 1 puff into the lungs every morning. 1 each 11  . HYDROcodone-acetaminophen (NORCO) 5-325 MG tablet 1 tabs po q6 hours prn pain 40 tablet 0  . hydrocortisone (ANUSOL-HC) 25 MG suppository Place 1 suppository (25 mg total) rectally 2 (two) times daily. 20 suppository 0  . ibuprofen (ADVIL,MOTRIN) 600 MG tablet Take 1 tablet (600 mg total) by mouth every 8 (eight) hours as needed for moderate pain. 90 tablet 2  .  methylPREDNISolone (MEDROL DOSEPAK) 4 MG TBPK tablet As directed 21 tablet 0  . mometasone (NASONEX) 50 MCG/ACT nasal spray Two sprays in each nostril daily. 17 g 1  . tiZANidine (ZANAFLEX) 4 MG tablet TAKE 1 TABLET (4 MG TOTAL) BY MOUTH EVERY 8 (EIGHT) HOURS AS NEEDED FOR MUSCLE SPASMS 90 tablet 3  . triamcinolone ointment (KENALOG) 0.1 % APPLY TO AFFECTED AREA TWICE A DAY 80 g 0   No current facility-administered medications for this visit.    Allergies  Allergen Reactions  . Tramadol Nausea Only      Discussed warning signs or symptoms. Please see discharge instructions. Patient expresses understanding.  The above documentation has been reviewed and is accurate and complete Lynne Leader

## 2019-04-04 NOTE — Telephone Encounter (Signed)
Requested medication (s) are due for refill today: yes  Requested medication (s) are on the active medication list: yes  Last refill:  02/17/2019  Future visit scheduled: no  Notes to clinic:  Refill cannot be delegated  Scheduling cortisone shots with sports med, Tennis elbow/severe pain   Requested Prescriptions  Pending Prescriptions Disp Refills   HYDROcodone-acetaminophen (NORCO) 5-325 MG tablet 40 tablet 0    Sig: 1 tabs po q6 hours prn pain     Not Delegated - Analgesics:  Opioid Agonist Combinations Failed - 04/04/2019 10:11 AM      Failed - This refill cannot be delegated      Failed - Urine Drug Screen completed in last 360 days.      Passed - Valid encounter within last 6 months    Recent Outpatient Visits          2 weeks ago Other migraine without status migrainosus, not intractable   [place] Plotnikov, Evie Lacks, MD   4 months ago Left ear pain   [place], MD   7 months ago Blood in stool   [place], MD   10 months ago Sinusitis, unspecified chronicity, unspecified location   [facility], Mortimer Fries, MD   11 months ago Acute non-recurrent sphenoidal sinusitis   [facility], Enid Baas, MD

## 2019-04-04 NOTE — Patient Instructions (Addendum)
Thank you for coming in today. Use the counter force brace.  Do the exercises.  Recheck as needed.  Call or go to the ER if you develop a large red swollen joint with extreme pain or oozing puss.  Also use the voltaren gel up to 4x daily for pain.   Tennis Elbow  Tennis elbow (lateral epicondylitis) is inflammation of tendons in your outer forearm, near your elbow. Tendons are tissues that connect muscle to bone. When you have tennis elbow, inflammation affects the tendons that you use to bend your wrist and move your hand up. Inflammation occurs in the lower part of the upper arm bone (humerus), where the tendons connect to the bone (lateral epicondyle). Tennis elbow often affects people who play tennis, but anyone may get the condition from repeatedly extending the wrist or turning the forearm. What are the causes? This condition is usually caused by repeatedly extending the wrist, turning the forearm, and using the hands. It can result from sports or work that requires repetitive forearm movements. In some cases, it may be caused by a sudden injury. What increases the risk? You are more likely to develop tennis elbow if you play tennis or another racket sport. You also have a higher risk if you frequently use your hands for work. Besides people who play tennis, others at greater risk include:  Musicians.  Carpenters, painters, and plumbers.  Cooks.  Cashiers.  People who work in [employer].  Architect workers.  Butchers.  People who use computers. What are the signs or symptoms? Symptoms of this condition include:  Pain and tenderness in the forearm and the outer part of the elbow. Pain may be felt only when using the arm, or it may be there all the time.  A burning feeling that starts in the elbow and spreads down the forearm.  A weak grip in the hand. How is this diagnosed? This condition may be diagnosed based on:  Your symptoms and medical history.  A physical  exam.  X-rays.  MRI. How is this treated? Resting and icing your arm is often the first treatment. Your health care provider may also recommend:  Medicines to reduce pain and inflammation. These may be in the form of a pill, topical gels, or shots of a steroid medicine (cortisone).  An elbow strap to reduce stress on the area.  Physical therapy. This may include massage or exercises.  An elbow brace to restrict the movements that cause symptoms. If these treatments do not help relieve your symptoms, your health care provider may recommend surgery to remove damaged muscle and reattach healthy muscle to bone. Follow these instructions at home: Activity  Rest your elbow and wrist and avoid activities that cause symptoms, as told by your health care provider.  Do physical therapy exercises as instructed.  If you lift an object, lift it with your palm facing up. This reduces stress on your elbow. Lifestyle  If your tennis elbow is caused by sports, check your equipment and make sure that: ? You are using it correctly. ? It is the best fit for you.  If your tennis elbow is caused by work or computer use, take frequent breaks to stretch your arm. Talk with your manager about ways to manage your condition at work. If you have a brace:  Wear the brace or strap as told by your health care provider. Remove it only as told by your health care provider.  Loosen the brace if your fingers tingle,  become numb, or turn cold and blue.  Keep the brace clean.  If the brace is not waterproof, ask if you may remove it for bathing. If you must keep the brace on while bathing: ? Do not let it get wet. ? Cover it with a watertight covering when you take a bath or a shower. General instructions   If directed, put ice on the painful area: ? Put ice in a plastic bag. ? Place a towel between your skin and the bag. ? Leave the ice on for 20 minutes, 2-3 times a day.  Take over-the-counter and  prescription medicines only as told by your health care provider.  Keep all follow-up visits as told by your health care provider. This is important. Contact a health care provider if:  You have pain that gets worse or does not get better with treatment.  You have numbness or weakness in your forearm, hand, or fingers. Summary  Tennis elbow (lateral epicondylitis) is inflammation of tendons in your outer forearm, near your elbow.  Common symptoms include pain and tenderness in your forearm and the outer part of your elbow.  This condition is usually caused by repeatedly extending your wrist, turning your forearm, and using your hands.  The first treatment is often resting and icing your arm to relieve symptoms. Further treatment may include taking medicine, getting physical therapy, wearing a brace or strap, or having surgery. This information is not intended to replace advice given to you by your health care provider. Make sure you discuss any questions you have with your health care provider. Document Released: 04/20/2005 Document Revised: 01/14/2018 Document Reviewed: 02/02/2017 Elsevier Patient Education  [place].   Please perform the exercise program that we have prepared for you and gone over in detail on a daily basis.  In addition to the handout you were provided you can access your program through: [email or web address]   Your unique program code is:  [email or web address]  We're moving!  Dr. Clovis Riley new office will be located at [address] on the 1st floor.  This location is across the street from the [employer] and in the same complex as the [facility] and [employer]  Our new office phone number will be [phone].  We anticipate beginning to see patients at the [facility] office in early December 2020.

## 2019-04-04 NOTE — Telephone Encounter (Signed)
Medication Refill - Medication: HYDROcodone-acetaminophen (NORCO) 5-325 MG tablet  (scheduling cortisone shot with Sports Med, Tennis elbow/severe pain)  Has the patient contacted their pharmacy? Yes.   (Agent: If no, request that the patient contact the pharmacy for the refill.) (Agent: If yes, when and what did the pharmacy advise?)  Preferred Pharmacy (with phone number or street name):  [id number] [address], [place].  [place] [address]  Phone: [phone] Fax: [phone]     Agent: Please be advised that RX refills may take up to 3 business days. We ask that you follow-up with your pharmacy.

## 2019-04-05 NOTE — Telephone Encounter (Signed)
Control database checked last refill:02/17/2019 40 tabs LOV: 03/16/2019 [id number]

## 2019-04-06 MED ORDER — HYDROCODONE-ACETAMINOPHEN 5-325 MG PO TABS: ORAL_TABLET | ORAL | 0 refills | Status: DC

## 2019-04-29 ENCOUNTER — Other Ambulatory Visit: Payer: Self-pay | Admitting: Internal Medicine

## 2019-07-28 ENCOUNTER — Other Ambulatory Visit: Payer: Self-pay

## 2019-07-28 NOTE — Telephone Encounter (Signed)
  Medication Refill - Medication: HYDROcodone-acetaminophen (NORCO) 5-325 MG tablet   Has the patient contacted their pharmacy? Yes.   (Agent: If no, request that the patient contact the pharmacy for the refill.) (Agent: If yes, when and what did the pharmacy advise?)  Preferred Pharmacy (with phone number or street name): [id number] [place], [place].  Agent: Please be advised that RX refills may take up to 3 business days. We ask that you follow-up with your pharmacy.

## 2019-07-30 MED ORDER — HYDROCODONE-ACETAMINOPHEN 5-325 MG PO TABS: ORAL_TABLET | ORAL | 0 refills | Status: DC

## 2019-08-10 ENCOUNTER — Ambulatory Visit: Payer: Self-pay

## 2019-08-10 ENCOUNTER — Encounter: Payer: Self-pay | Admitting: Family Medicine

## 2019-08-10 ENCOUNTER — Other Ambulatory Visit: Payer: Self-pay

## 2019-08-10 ENCOUNTER — Ambulatory Visit (INDEPENDENT_AMBULATORY_CARE_PROVIDER_SITE_OTHER): Payer: 59 | Admitting: Family Medicine

## 2019-08-10 VITALS — BP 130/78 | HR 73 | Ht 69.0 in | Wt 193.4 lb

## 2019-08-10 DIAGNOSIS — M25521 Pain in right elbow: Secondary | ICD-10-CM

## 2019-08-10 DIAGNOSIS — G8929 Other chronic pain: Secondary | ICD-10-CM | POA: Diagnosis not present

## 2019-08-10 NOTE — Patient Instructions (Signed)
Thank you for coming in today. Call or go to the ER if you develop a large red swollen joint with extreme pain or oozing puss.  Continue to work on those exercises.  Use the wrist brace as needed.  [employer] here in town.   Recheck as needed.

## 2019-08-10 NOTE — Progress Notes (Signed)
I, Zachary George, LAT, ATC, am serving as scribe for Dr. Lynne George.  Zachary George is a 54 y.o. male who presents to [place] at [facility] today for f/u of his R elbow pain.  He was last seen by Dr. Georgina George on 04/04/19 and was elbow pain and swelling w/ radiating pain into his R forearm and hand.  He had a R lateral epicondyle injection and was provided w/ a HEP.  Since his last visit, pt reports that he was doing ok until he cranked his weedeater the other day and flared up his R lateral epicondyle.  He has been doing his HEP 2-3x/week.  He notes some mild swelling in his R lateral elbow.   Pertinent review of systems: No fevers or chills  Relevant historical information: Patient is a Manufacturing engineer   Exam:  BP 130/78 (BP Location: Left Arm, Patient Position: Sitting, Cuff Size: Large)   Pulse 73   Ht 5\' 9"  (1.753 m)   Wt 193 lb 6.4 oz (87.7 kg)   SpO2 96%   BMI 28.56 kg/m  General: Well Developed, well nourished, and in no acute distress.   MSK: Right elbow normal-appearing tender palpation lateral epicondyle.  Pain with resisted wrist and hand extension.  Intact strength. Normal elbow motion. Pulses cap refill and sensation are intact distally.    Lab and Radiology Results  Diagnostic Limited MSK Ultrasound of: Right lateral elbow Lateral epicondyle normal-appearing intact.  Common extensor tendon insertion intact superficially.  Hypoechoic structure at deep portion consistent with partial tear. Normal lateral elbow structures otherwise. Impression: Lateral epicondylitis with partial tear   Procedure: Real-time Ultrasound Guided Injection of right lateral epicondyle Device: Philips Affiniti 50G Images permanently stored and available for review in the ultrasound unit. Verbal informed consent obtained.  Discussed risks and benefits of procedure. Warned about infection bleeding damage to structures skin hypopigmentation and fat atrophy among  others. Patient expresses understanding and agreement Time-out conducted.   Noted no overlying erythema, induration, or other signs of local infection.   Skin prepped in a sterile fashion.   Local anesthesia: Topical Ethyl chloride.   With sterile technique and under real time ultrasound guidance:  40 mg of Kenalog and 2 mL of Marcaine injected easily.   Completed without difficulty   Pain immediately resolved suggesting accurate placement of the medication.   Advised to call if fevers/chills, erythema, induration, drainage, or persistent bleeding.   Images permanently stored and available for review in the ultrasound unit.  Impression: Technically successful ultrasound guided injection.       Assessment and Plan: 54 y.o. male with right lateral elbow pain.  Patient describes sudden worsening pain after pulling on a weedeater.  Physical exam and ultrasound examination consistent with partial tear.  Fortunately no full-thickness tear seen on ultrasound today.  Patient has upcoming [place] shows.  I think it is reasonable to proceed with injection as he wishes.  Injection today.  Recommend wrist brace as needed for heavy duty activities.  Continue counterforce brace and home exercise program as taught at last visit.  Recheck back with me as needed.   PDMP not reviewed this encounter. Orders Placed This Encounter  Procedures  . [place] LIMITED JOINT SPACE STRUCTURES UP RIGHT(NO LINKED CHARGES)    Order Specific Question:   Reason for Exam (SYMPTOM  OR DIAGNOSIS REQUIRED)    Answer:   R elbow pain    Order Specific Question:   Preferred imaging location?  Answer:   [place]   No orders of the defined types were placed in this encounter.    Discussed warning signs or symptoms. Please see discharge instructions. Patient expresses understanding.   The above documentation has been reviewed and is accurate and complete Zachary George

## 2019-08-25 ENCOUNTER — Other Ambulatory Visit: Payer: Self-pay | Admitting: Internal Medicine

## 2019-08-25 ENCOUNTER — Telehealth (INDEPENDENT_AMBULATORY_CARE_PROVIDER_SITE_OTHER): Payer: 59 | Admitting: Internal Medicine

## 2019-08-25 ENCOUNTER — Telehealth: Payer: Self-pay | Admitting: Internal Medicine

## 2019-08-25 DIAGNOSIS — R05 Cough: Secondary | ICD-10-CM | POA: Diagnosis not present

## 2019-08-25 DIAGNOSIS — R062 Wheezing: Secondary | ICD-10-CM | POA: Diagnosis not present

## 2019-08-25 DIAGNOSIS — J309 Allergic rhinitis, unspecified: Secondary | ICD-10-CM

## 2019-08-25 DIAGNOSIS — K219 Gastro-esophageal reflux disease without esophagitis: Secondary | ICD-10-CM | POA: Diagnosis not present

## 2019-08-25 MED ORDER — HYDROCODONE-HOMATROPINE 5-1.5 MG/5ML PO SYRP: 5.0000 mL | ORAL_SOLUTION | Freq: Four times a day (QID) | ORAL | 0 refills | Status: DC | PRN

## 2019-08-25 MED ORDER — PREDNISONE 10 MG PO TABS: ORAL_TABLET | ORAL | 0 refills | Status: DC

## 2019-08-25 MED ORDER — DOXYCYCLINE HYCLATE 100 MG PO TABS: 100.0000 mg | ORAL_TABLET | Freq: Two times a day (BID) | ORAL | 0 refills | Status: DC

## 2019-08-25 MED ORDER — HYDROCODONE-HOMATROPINE 5-1.5 MG/5ML PO SYRP: 5.0000 mL | ORAL_SOLUTION | Freq: Four times a day (QID) | ORAL | 0 refills | Status: AC | PRN

## 2019-08-25 MED ORDER — BENZONATATE 100 MG PO CAPS: 100.0000 mg | ORAL_CAPSULE | Freq: Three times a day (TID) | ORAL | 2 refills | Status: DC | PRN

## 2019-08-25 NOTE — Telephone Encounter (Signed)
New message:   Patient is calling and states he is getting a message from the pharmacy stating that it is too early to fill his prescription that Dr. Jenny Reichmann just sent in for him. He states one of the meds was sent to [facility] and he is in [place]. Please advise.   [facility] [place] [place] [phone]

## 2019-08-25 NOTE — Telephone Encounter (Signed)
I cancelled local hycodan  Spoke to [facility] and they are unable to accept hycodan by out of state provider  [place] for [employer] - done erx

## 2019-08-26 ENCOUNTER — Encounter: Payer: Self-pay | Admitting: Internal Medicine

## 2019-08-26 NOTE — Assessment & Plan Note (Signed)
stable overall by history and exam, recent data reviewed with pt, and pt to continue medical treatment as before,  to f/u any worsening symptoms or concerns  

## 2019-08-26 NOTE — Assessment & Plan Note (Signed)
Also to improved with predpac

## 2019-08-26 NOTE — Patient Instructions (Signed)
Please take all new medication as prescribed 

## 2019-08-26 NOTE — Assessment & Plan Note (Addendum)
Mild to mod, cant r/o bronchitis vs pna, unable for cxr, for antibx course, cough med prn, to f/u any worsening symptoms or concerns  I spent 32 minutes in preparing to see the patient by review of recent labs, imaging and procedures, obtaining and reviewing separately obtained history, communicating with the patient and family or caregiver, ordering medications, tests or procedures, and documenting clinical information in the EHR including the differential Dx, treatment, and any further evaluation and other management of cough, wheezing, allergies, gerd

## 2019-08-26 NOTE — Progress Notes (Signed)
Patient ID: Zachary George, male   DOB: 12-23-1965, 54 y.o.   MRN: [id number]  Virtual Visit via Video Note  I connected with Zachary Filbert Bo on Aug 25, 2019 at  2:40 PM EDT by a video enabled telemedicine application and verified that I am speaking with the correct person using two identifiers.  Location: Patient: at home Provider: at office   I discussed the limitations of evaluation and management by telemedicine and the availability of in person appointments. The patient expressed understanding and agreed to proceed.  History of Present Illness: Here with acute onset mild to mod 2-3 days ST, HA, general weakness and malaise, with prod cough greenish sputum, but Pt denies chest pain, increased sob or doe, wheezing, orthopnea, PND, increased LE swelling, palpitations, dizziness or syncope except for onset mild wheezing and sob since last pm.  Pt denies new neurological symptoms such as new headache, or facial or extremity weakness or numbness   Pt denies polydipsia, polyuria.  Pt currenly in [place] fe [place] for work.  Denies worsening reflux, abd pain, dysphagia, n/v, bowel change or blood.  Does have several wks ongoing nasal allergy symptoms with clearish congestion, itch and sneezing, without fever, pain, ST, cough, swelling or wheezing. Past Medical History:  Diagnosis Date  . Allergic rhinitis, cause unspecified 09/05/2013  . Allergy   . GERD (gastroesophageal reflux disease)   . Glaucoma   . IBS (irritable bowel syndrome)   . Left elbow pain 2011   tennis elbow   Past Surgical History:  Procedure Laterality Date  . LIGAMENT REPAIR Right 04/22/2017   Procedure: RIGHT THUMB REPAIR;  Surgeon: Leanora Cover, MD;  Location: [facility];  Service: Orthopedics;  Laterality: Right;  . ULNAR COLLATERAL LIGAMENT REPAIR Right 04/22/2017   Procedure: RADIAL AND ULNAR COLLATERAL LIGAMENT REPAIR;  Surgeon: Leanora Cover, MD;  Location: [facility];  Service: Orthopedics;   Laterality: Right;    reports that he has never smoked. He has never used smokeless tobacco. He reports current alcohol use. He reports that he does not use drugs. family history includes Colon cancer in his father; Hypertension in his father. Allergies  Allergen Reactions  . Tramadol Nausea Only   Current Outpatient Medications on File Prior to Visit  Medication Sig Dispense Refill  . Cholecalciferol (VITAMIN D3) 2000 units capsule Take 1 capsule (2,000 Units total) by mouth daily. 100 capsule 3  . fluticasone furoate-vilanterol (BREO ELLIPTA) 200-25 MCG/INH AEPB Inhale 1 puff into the lungs every morning. 1 each 11  . HYDROcodone-acetaminophen (NORCO) 5-325 MG tablet 1 tabs po q6 hours prn pain 40 tablet 0  . ibuprofen (ADVIL,MOTRIN) 600 MG tablet Take 1 tablet (600 mg total) by mouth every 8 (eight) hours as needed for moderate pain. 90 tablet 2  . mometasone (NASONEX) 50 MCG/ACT nasal spray Two sprays in each nostril daily. (Patient not taking: Reported on 08/10/2019) 17 g 1  . tiZANidine (ZANAFLEX) 4 MG tablet TAKE 1 TABLET (4 MG TOTAL) BY MOUTH EVERY 8 (EIGHT) HOURS AS NEEDED FOR MUSCLE SPASMS 90 tablet 3  . triamcinolone ointment (KENALOG) 0.1 % APPLY TO AFFECTED AREA TWICE A DAY (Patient not taking: Reported on 08/10/2019) 80 g 0   No current facility-administered medications on file prior to visit.    Observations/Objective: Alert, NAD, appropriate mood and affect, resps normal, cn 2-12 intact, moves all 4s, no visible rash or swelling Lab Results  Component Value Date   WBC 6.6 08/18/2016   HGB 16.0 08/18/2016  HCT 47.3 08/18/2016   PLT 238.0 08/18/2016   GLUCOSE 100 (H) 08/18/2016   CHOL 217 (H) 05/22/2013   TRIG 48.0 05/22/2013   HDL 60.10 05/22/2013   LDLDIRECT 156.0 05/22/2013   LDLCALC 128 (H) 09/01/2011   ALT 26 08/18/2016   AST 20 08/18/2016   NA 139 08/18/2016   K 4.2 08/18/2016   CL 104 08/18/2016   CREATININE 1.01 08/18/2016   BUN 20 08/18/2016   CO2 29  08/18/2016   TSH 1.25 08/18/2016   PSA 1.45 09/01/2011   Assessment and Plan: See notes  Follow Up Instructions: See notes   I discussed the assessment and treatment plan with the patient. The patient was provided an opportunity to ask questions and all were answered. The patient agreed with the plan and demonstrated an understanding of the instructions.   The patient was advised to call back or seek an in-person evaluation if the symptoms worsen or if the condition fails to improve as anticipated.   Cathlean Cower, MD

## 2019-08-26 NOTE — Assessment & Plan Note (Signed)
/  Mild to mod, for predpac asd,  to f/u any worsening symptoms or concerns 

## 2019-08-28 NOTE — Telephone Encounter (Signed)
Left VM for pt to go to pharmacy to pick up new med per Dr. Jenny Reichmann. Also told pt to call office back if there are any problems with new med.

## 2019-10-26 ENCOUNTER — Telehealth (INDEPENDENT_AMBULATORY_CARE_PROVIDER_SITE_OTHER): Payer: No Typology Code available for payment source | Admitting: Internal Medicine

## 2019-10-26 DIAGNOSIS — R05 Cough: Secondary | ICD-10-CM

## 2019-10-26 DIAGNOSIS — J309 Allergic rhinitis, unspecified: Secondary | ICD-10-CM | POA: Diagnosis not present

## 2019-10-26 DIAGNOSIS — R059 Cough, unspecified: Secondary | ICD-10-CM

## 2019-10-26 DIAGNOSIS — R062 Wheezing: Secondary | ICD-10-CM

## 2019-10-26 MED ORDER — BREO ELLIPTA 200-25 MCG/INH IN AEPB: 1.0000 | INHALATION_SPRAY | Freq: Every morning | RESPIRATORY_TRACT | 11 refills | Status: DC

## 2019-10-26 MED ORDER — HYDROCODONE-HOMATROPINE 5-1.5 MG/5ML PO SYRP: 5.0000 mL | ORAL_SOLUTION | Freq: Four times a day (QID) | ORAL | 0 refills | Status: AC | PRN

## 2019-10-26 MED ORDER — PREDNISONE 10 MG PO TABS: ORAL_TABLET | ORAL | 0 refills | Status: DC

## 2019-10-26 MED ORDER — AZITHROMYCIN 250 MG PO TABS: ORAL_TABLET | ORAL | 1 refills | Status: DC

## 2019-10-26 NOTE — Progress Notes (Signed)
Patient ID: Zachary George, male   DOB: 05/17/1965, 54 y.o.   MRN: [id number]  Virtual Visit via Video Note  I connected with Zachary George on 10/26/19 at 10:20 AM EDT by a video enabled telemedicine application and verified that I am speaking with the correct person using two identifiers.  Location: of all participants today Patient: at home Provider: at office   I discussed the limitations of evaluation and management by telemedicine and the availability of in person appointments. The patient expressed understanding and agreed to proceed.  History of Present Illness:  Here with 2-3 days acute onset fever, facial pain, pressure, headache, general weakness and malaise, and greenish d/c, with mild ST and cough, but pt denies chest pain, wheezing, increased sob or doe, orthopnea, PND, increased LE swelling, palpitations, dizziness or syncope, except for onset mild wheezing and sob as per last visit when he was [place] on [place]. Did have a sort burning feeling all over with doxy and does not want that.  Also Does have several wks ongoing nasal allergy symptoms with clearish congestion, itch and sneezing, without fever, pain, ST, cough, swelling or wheezing. Past Medical History:  Diagnosis Date  . Allergic rhinitis, cause unspecified 09/05/2013  . Allergy   . GERD (gastroesophageal reflux disease)   . Glaucoma   . IBS (irritable bowel syndrome)   . Left elbow pain 2011   tennis elbow   Past Surgical History:  Procedure Laterality Date  . LIGAMENT REPAIR Right 04/22/2017   Procedure: RIGHT THUMB REPAIR;  Surgeon: Leanora Cover, MD;  Location: [facility];  Service: Orthopedics;  Laterality: Right;  . ULNAR COLLATERAL LIGAMENT REPAIR Right 04/22/2017   Procedure: RADIAL AND ULNAR COLLATERAL LIGAMENT REPAIR;  Surgeon: Leanora Cover, MD;  Location: [facility];  Service: Orthopedics;  Laterality: Right;    reports that he has never smoked. He has never used smokeless  tobacco. He reports current alcohol use. He reports that he does not use drugs. family history includes Colon cancer in his father; Hypertension in his father. Allergies  Allergen Reactions  . Tramadol Nausea Only   Current Outpatient Medications on File Prior to Visit  Medication Sig Dispense Refill  . Cholecalciferol (VITAMIN D3) 2000 units capsule Take 1 capsule (2,000 Units total) by mouth daily. 100 capsule 3  . HYDROcodone-acetaminophen (NORCO) 5-325 MG tablet 1 tabs po q6 hours prn pain 40 tablet 0  . ibuprofen (ADVIL,MOTRIN) 600 MG tablet Take 1 tablet (600 mg total) by mouth every 8 (eight) hours as needed for moderate pain. 90 tablet 2  . mometasone (NASONEX) 50 MCG/ACT nasal spray Two sprays in each nostril daily. (Patient not taking: Reported on 08/10/2019) 17 g 1  . tiZANidine (ZANAFLEX) 4 MG tablet TAKE 1 TABLET (4 MG TOTAL) BY MOUTH EVERY 8 (EIGHT) HOURS AS NEEDED FOR MUSCLE SPASMS 90 tablet 3  . triamcinolone ointment (KENALOG) 0.1 % APPLY TO AFFECTED AREA TWICE A DAY (Patient not taking: Reported on 08/10/2019) 80 g 0   No current facility-administered medications on file prior to visit.   Observations/Objective: Alert, NAD, appropriate mood and affect, resps normal, cn 2-12 intact, moves all 4s, no visible rash or swelling Lab Results  Component Value Date   WBC 6.6 08/18/2016   HGB 16.0 08/18/2016   HCT 47.3 08/18/2016   PLT 238.0 08/18/2016   GLUCOSE 100 (H) 08/18/2016   CHOL 217 (H) 05/22/2013   TRIG 48.0 05/22/2013   HDL 60.10 05/22/2013   LDLDIRECT 156.0  05/22/2013   LDLCALC 128 (H) 09/01/2011   ALT 26 08/18/2016   AST 20 08/18/2016   NA 139 08/18/2016   K 4.2 08/18/2016   CL 104 08/18/2016   CREATININE 1.01 08/18/2016   BUN 20 08/18/2016   CO2 29 08/18/2016   TSH 1.25 08/18/2016   PSA 1.45 09/01/2011   Assessment and Plan: See notes  Follow Up Instructions: See notes   I discussed the assessment and treatment plan with the patient. The patient was  provided an opportunity to ask questions and all were answered. The patient agreed with the plan and demonstrated an understanding of the instructions.   The patient was advised to call back or seek an in-person evaluation if the symptoms worsen or if the condition fails to improve as anticipated.  Cathlean Cower, MD

## 2019-10-29 ENCOUNTER — Encounter: Payer: Self-pay | Admitting: Internal Medicine

## 2019-10-29 NOTE — Assessment & Plan Note (Signed)
Mild to mod, c/w bronchitis vs pna, declines cxr, for antibx course, cough med prn,  to f/u any worsening symptoms or concerns 

## 2019-10-29 NOTE — Assessment & Plan Note (Signed)
Mild to mod, for predpac as above,  to f/u any worsening symptoms or concerns

## 2019-10-29 NOTE — Assessment & Plan Note (Signed)
Mild, c/w bronchospasm, for predpac asd,  to f/u any worsening symptoms or concerns

## 2019-10-29 NOTE — Patient Instructions (Signed)
Please take all new medication as prescribed 

## 2019-11-24 ENCOUNTER — Other Ambulatory Visit: Payer: Self-pay | Admitting: Internal Medicine

## 2019-11-24 NOTE — Telephone Encounter (Signed)
  Patient requesting refill on HYDROcodone-acetaminophen (NORCO) 5-325 MG tablet [id number] [place] [place] [address].

## 2019-11-27 NOTE — Telephone Encounter (Signed)
[place] Controlled Database Checked Last filled: 10/26/2019 (180) LOV w/you: 03/16/2019 Next appt w/you: none

## 2019-12-04 ENCOUNTER — Other Ambulatory Visit: Payer: Self-pay

## 2019-12-04 ENCOUNTER — Encounter: Payer: Self-pay | Admitting: Internal Medicine

## 2019-12-04 ENCOUNTER — Ambulatory Visit (INDEPENDENT_AMBULATORY_CARE_PROVIDER_SITE_OTHER): Payer: No Typology Code available for payment source | Admitting: Internal Medicine

## 2019-12-04 ENCOUNTER — Telehealth: Payer: Self-pay

## 2019-12-04 VITALS — BP 148/98 | HR 86 | Temp 98.4°F | Ht 69.0 in | Wt 191.0 lb

## 2019-12-04 DIAGNOSIS — Z0000 Encounter for general adult medical examination without abnormal findings: Secondary | ICD-10-CM

## 2019-12-04 DIAGNOSIS — R059 Cough, unspecified: Secondary | ICD-10-CM

## 2019-12-04 DIAGNOSIS — R011 Cardiac murmur, unspecified: Secondary | ICD-10-CM

## 2019-12-04 DIAGNOSIS — E785 Hyperlipidemia, unspecified: Secondary | ICD-10-CM

## 2019-12-04 DIAGNOSIS — R05 Cough: Secondary | ICD-10-CM | POA: Diagnosis not present

## 2019-12-04 DIAGNOSIS — R062 Wheezing: Secondary | ICD-10-CM | POA: Diagnosis not present

## 2019-12-04 LAB — COMPLETE METABOLIC PANEL WITH GFR
AG Ratio: 2.1 (calc) (ref 1.0–2.5)
ALT: 33 U/L (ref 9–46)
AST: 23 U/L (ref 10–35)
Albumin: 4.8 g/dL (ref 3.6–5.1)
Alkaline phosphatase (APISO): 72 U/L (ref 35–144)
BUN: 14 mg/dL (ref 7–25)
CO2: 30 mmol/L (ref 20–32)
Calcium: 10.2 mg/dL (ref 8.6–10.3)
Chloride: 102 mmol/L (ref 98–110)
Creat: 0.98 mg/dL (ref 0.70–1.33)
GFR, Est African American: 102 mL/min/{1.73_m2} (ref >=60)
GFR, Est Non African American: 88 mL/min/{1.73_m2} (ref >=60)
Globulin: 2.3 g/dL (calc) (ref 1.9–3.7)
Glucose, Bld: 95 mg/dL (ref 65–99)
Potassium: 4.3 mmol/L (ref 3.5–5.3)
Sodium: 140 mmol/L (ref 135–146)
Total Bilirubin: 0.4 mg/dL (ref 0.2–1.2)
Total Protein: 7.1 g/dL (ref 6.1–8.1)

## 2019-12-04 MED ORDER — ALBUTEROL SULFATE HFA 108 (90 BASE) MCG/ACT IN AERS: INHALATION_SPRAY | RESPIRATORY_TRACT | 5 refills | Status: AC

## 2019-12-04 MED ORDER — HYDROCODONE-ACETAMINOPHEN 5-325 MG PO TABS: ORAL_TABLET | ORAL | 0 refills | Status: DC

## 2019-12-04 NOTE — Addendum Note (Signed)
Addended by: Cresenciano Lick on: 12/04/2019 10:07 AM   Modules accepted: Orders

## 2019-12-04 NOTE — Patient Instructions (Signed)

## 2019-12-04 NOTE — Telephone Encounter (Signed)
-----   Message from Octavio Manns sent at 12/04/2019  4:22 PM EDT ----- Can you please change location on pt's Echocardiogram to Outpatient imaging [place]

## 2019-12-04 NOTE — Assessment & Plan Note (Signed)
ECHO

## 2019-12-04 NOTE — Progress Notes (Signed)
Subjective:  Patient ID: Zachary George, male    DOB: [birth year]  Age: 54 y.o. MRN: [id number]  CC: No chief complaint on file.   HPI Zachary George presents for LBP C/o bronchitis x 2. He saw dr Jenny Reichmann - he added Singulair, Albuterol, Zpac    Outpatient Medications Prior to Visit  Medication Sig Dispense Refill   albuterol (VENTOLIN HFA) 108 (90 Base) MCG/ACT inhaler SMARTSIG:2 Puff(s) By Mouth Every 6 Hours PRN     azithromycin (ZITHROMAX) 250 MG tablet 2 tab by mouth day 1, then 1 per day 6 tablet 1   Cholecalciferol (VITAMIN D3) 2000 units capsule Take 1 capsule (2,000 Units total) by mouth daily. 100 capsule 3   cyclobenzaprine (FLEXERIL) 10 MG tablet Take 10 mg by mouth at bedtime.     fluticasone furoate-vilanterol (BREO ELLIPTA) 200-25 MCG/INH AEPB Inhale 1 puff into the lungs every morning. 1 each 11   HYDROcodone-acetaminophen (NORCO) 5-325 MG tablet 1 tabs po q6 hours prn pain 40 tablet 0   ibuprofen (ADVIL,MOTRIN) 600 MG tablet Take 1 tablet (600 mg total) by mouth every 8 (eight) hours as needed for moderate pain. 90 tablet 2   mometasone (NASONEX) 50 MCG/ACT nasal spray Two sprays in each nostril daily. 17 g 1   montelukast (SINGULAIR) 10 MG tablet SMARTSIG:1 Tablet(s) By Mouth Every Evening     predniSONE (DELTASONE) 10 MG tablet 3 tabs by mouth per day for 3 days,2tabs per day for 3 days,2tab per day for 3 days 18 tablet 0   tiZANidine (ZANAFLEX) 4 MG tablet TAKE 1 TABLET (4 MG TOTAL) BY MOUTH EVERY 8 (EIGHT) HOURS AS NEEDED FOR MUSCLE SPASMS 90 tablet 3   triamcinolone ointment (KENALOG) 0.1 % APPLY TO AFFECTED AREA TWICE A DAY 80 g 0   No facility-administered medications prior to visit.    ROS: Review of Systems  Constitutional: Negative for appetite change, fatigue and unexpected weight change.  HENT: Negative for congestion, nosebleeds, sneezing, sore throat and trouble swallowing.   Eyes: Negative for itching and visual disturbance.  Respiratory:  Positive for cough and wheezing.   Cardiovascular: Negative for chest pain, palpitations and leg swelling.  Gastrointestinal: Negative for abdominal distention, blood in stool, diarrhea and nausea.  Genitourinary: Negative for frequency and hematuria.  Musculoskeletal: Negative for back pain, gait problem, joint swelling and neck pain.  Skin: Negative for rash.  Neurological: Negative for dizziness, tremors, speech difficulty and weakness.  Psychiatric/Behavioral: Negative for agitation, dysphoric mood and sleep disturbance. The patient is not nervous/anxious.     Objective:  BP (!) 148/98 (BP Location: Right Arm, Patient Position: Sitting, Cuff Size: Large)    Pulse 86    Temp 98.4 F (36.9 C) (Oral)    Ht 5\' 9"  (1.753 m)    Wt 191 lb (86.6 kg)    SpO2 95%    BMI 28.21 kg/m   BP Readings from Last 3 Encounters:  12/04/19 (!) 148/98  08/10/19 130/78  04/04/19 (!) 142/88    Wt Readings from Last 3 Encounters:  12/04/19 191 lb (86.6 kg)  08/10/19 193 lb 6.4 oz (87.7 kg)  04/04/19 192 lb 6.4 oz (87.3 kg)    Physical Exam Constitutional:      General: He is not in acute distress.    Appearance: He is well-developed.     Comments: NAD  Eyes:     Conjunctiva/sclera: Conjunctivae normal.     Pupils: Pupils are equal, round, and reactive to light.  Neck:  Thyroid: No thyromegaly.     Vascular: No JVD.  Cardiovascular:     Rate and Rhythm: Normal rate and regular rhythm.     Heart sounds: Normal heart sounds. No murmur heard.  No friction rub. No gallop.   Pulmonary:     Effort: Pulmonary effort is normal. No respiratory distress.     Breath sounds: Normal breath sounds. No wheezing or rales.  Chest:     Chest wall: No tenderness.  Abdominal:     General: Bowel sounds are normal. There is no distension.     Palpations: Abdomen is soft. There is no mass.     Tenderness: There is no abdominal tenderness. There is no guarding or rebound.  Musculoskeletal:        General: No  tenderness. Normal range of motion.     Cervical back: Normal range of motion.  Lymphadenopathy:     Cervical: No cervical adenopathy.  Skin:    General: Skin is warm and dry.     Findings: No rash.  Neurological:     Mental Status: He is alert and oriented to person, place, and time.     Cranial Nerves: No cranial nerve deficit.     Motor: No abnormal muscle tone.     Coordination: Coordination normal.     Gait: Gait normal.     Deep Tendon Reflexes: Reflexes are normal and symmetric.  Psychiatric:        Behavior: Behavior normal.        Thought Content: Thought content normal.        Judgment: Judgment normal.   ?murmur  Lab Results  Component Value Date   WBC 6.6 08/18/2016   HGB 16.0 08/18/2016   HCT 47.3 08/18/2016   PLT 238.0 08/18/2016   GLUCOSE 100 (H) 08/18/2016   CHOL 217 (H) 05/22/2013   TRIG 48.0 05/22/2013   HDL 60.10 05/22/2013   LDLDIRECT 156.0 05/22/2013   LDLCALC 128 (H) 09/01/2011   ALT 26 08/18/2016   AST 20 08/18/2016   NA 139 08/18/2016   K 4.2 08/18/2016   CL 104 08/18/2016   CREATININE 1.01 08/18/2016   BUN 20 08/18/2016   CO2 29 08/18/2016   TSH 1.25 08/18/2016   PSA 1.45 09/01/2011    DG Finger Ring Right  Result Date: 11/15/2018 CLINICAL DATA:  54 year old male with fishhooks through middle finger. EXAM: RIGHT RING FINGER 2+V COMPARISON:  None. FINDINGS: Metal fishhook imbedded in the soft tissues of the distal right 3rd finger. No bone involvement (images 2 and 3). No osseous abnormality identified. IMPRESSION: Imbedded fishhook.  No bone involvement. Electronically Signed   By: Genevie Ann M.D.   On: 11/15/2018 21:11    Assessment & Plan:   There are no diagnoses linked to this encounter.   No orders of the defined types were placed in this encounter.    Follow-up: No follow-ups on file.  Walker Kehr, MD

## 2019-12-04 NOTE — Assessment & Plan Note (Signed)
ECHO A cardiac CT scan for coronary calcium offered 7/21

## 2019-12-05 LAB — URINALYSIS
Bilirubin Urine: NEGATIVE
Glucose, UA: NEGATIVE
Hgb urine dipstick: NEGATIVE
Ketones, ur: NEGATIVE
Leukocytes,Ua: NEGATIVE
Nitrite: NEGATIVE
Protein, ur: NEGATIVE
Specific Gravity, Urine: 1.018 (ref 1.001–1.03)
pH: <=5 (ref 5.0–8.0)

## 2019-12-05 LAB — CBC WITH DIFFERENTIAL/PLATELET
Absolute Monocytes: 422 cells/uL (ref 200–950)
Basophils Absolute: 52 cells/uL (ref 0–200)
Basophils Relative: 0.7 %
Eosinophils Absolute: 89 cells/uL (ref 15–500)
Eosinophils Relative: 1.2 %
HCT: 51.4 % — ABNORMAL HIGH (ref 38.5–50.0)
Hemoglobin: 17 g/dL (ref 13.2–17.1)
Lymphs Abs: 1709 cells/uL (ref 850–3900)
MCH: 29.4 pg (ref 27.0–33.0)
MCHC: 33.1 g/dL (ref 32.0–36.0)
MCV: 88.8 fL (ref 80.0–100.0)
MPV: 9.7 fL (ref 7.5–12.5)
Monocytes Relative: 5.7 %
Neutro Abs: 5128 cells/uL (ref 1500–7800)
Neutrophils Relative %: 69.3 %
Platelets: 245 10*3/uL (ref 140–400)
RBC: 5.79 10*6/uL (ref 4.20–5.80)
RDW: 13 % (ref 11.0–15.0)
Total Lymphocyte: 23.1 %
WBC: 7.4 10*3/uL (ref 3.8–10.8)

## 2019-12-05 LAB — TSH: TSH: 1.8 mIU/L (ref 0.40–4.50)

## 2019-12-05 LAB — LIPID PANEL
Cholesterol: 263 mg/dL — ABNORMAL HIGH (ref <200)
HDL: 75 mg/dL (ref >=40)
LDL Cholesterol (Calc): 173 mg/dL (calc) — ABNORMAL HIGH
Non-HDL Cholesterol (Calc): 188 mg/dL (calc) — ABNORMAL HIGH (ref <130)
Total CHOL/HDL Ratio: 3.5 (calc) (ref <5.0)
Triglycerides: 53 mg/dL (ref <150)

## 2019-12-05 LAB — PSA: PSA: 1.6 ng/mL (ref <=4.0)

## 2019-12-13 ENCOUNTER — Other Ambulatory Visit: Payer: Self-pay | Admitting: *Deleted

## 2019-12-13 NOTE — Addendum Note (Signed)
Addended by: Earnstine Regal on: 12/13/2019 02:27 PM   Modules accepted: Orders

## 2019-12-26 ENCOUNTER — Encounter: Payer: Self-pay | Admitting: Internal Medicine

## 2019-12-29 ENCOUNTER — Ambulatory Visit (HOSPITAL_COMMUNITY): Payer: No Typology Code available for payment source | Attending: Cardiovascular Disease

## 2019-12-29 ENCOUNTER — Other Ambulatory Visit: Payer: Self-pay

## 2019-12-29 DIAGNOSIS — R011 Cardiac murmur, unspecified: Secondary | ICD-10-CM | POA: Diagnosis not present

## 2019-12-29 LAB — ECHOCARDIOGRAM COMPLETE
Area-P 1/2: 3.31 cm2
S' Lateral: 2.7 cm

## 2019-12-29 MED ORDER — PERFLUTREN LIPID MICROSPHERE
1.0000 mL | INTRAVENOUS | Status: AC | PRN
  Administered 2019-12-29: 2 mL via INTRAVENOUS

## 2020-02-13 ENCOUNTER — Ambulatory Visit: Payer: Self-pay

## 2020-02-13 ENCOUNTER — Other Ambulatory Visit: Payer: Self-pay

## 2020-02-13 ENCOUNTER — Ambulatory Visit (INDEPENDENT_AMBULATORY_CARE_PROVIDER_SITE_OTHER): Payer: No Typology Code available for payment source | Admitting: Family Medicine

## 2020-02-13 ENCOUNTER — Encounter: Payer: Self-pay | Admitting: Family Medicine

## 2020-02-13 VITALS — BP 130/90 | HR 91 | Ht 69.0 in | Wt 183.0 lb

## 2020-02-13 DIAGNOSIS — G8929 Other chronic pain: Secondary | ICD-10-CM

## 2020-02-13 DIAGNOSIS — M25521 Pain in right elbow: Secondary | ICD-10-CM

## 2020-02-13 DIAGNOSIS — M7711 Lateral epicondylitis, right elbow: Secondary | ICD-10-CM | POA: Diagnosis not present

## 2020-02-13 NOTE — Progress Notes (Signed)
Zachary George is a 54 y.o. male who presents to [place] at [facility] today for follow up of R elbow pain. Patient was last seen by Dr. Georgina Snell on 08/10/2019 and reported he was doing ok until he cranked his weedeater the other day and flared up his R lateral epicondyle.  He has been doing his HEP 2-3x/week.  He notes some mild swelling in his R lateral elbow. Patient was given steroid injection and informed to wear a brace and continue to do home exercise program. Since last visit patient reports,  That his elbow pain has never went away but it is manageable. Patient states he picked up his granddaughter and he heard something pop in his elbow and is now unable to straighten arm out without pain and cannot lift anything. Trying anything to help with pain tylenol, ibuprofen, BC powder has not helped. Cannot sleep well as the pain wakes him up. Describes pain as sharp. Patient states that the last visit there was talks of a possible PRP.   Patient also works as a Arts development officer.  He is not currently working much right now due to COVID-19.   Pertinent review of systems: No fevers or chills  Relevant historical information: History headache   Exam:  BP 130/90 (BP Location: Left Arm, Patient Position: Sitting, Cuff Size: Normal)   Pulse 91   Ht 5\' 9"  (1.753 m)   Wt 183 lb (83 kg)   SpO2 98%   BMI 27.02 kg/m  General: Well Developed, well nourished, and in no acute distress.   MSK: Right elbow normal-appearing Range of motion full flexion pronation supination.  Lost full extension by approximately 3 degrees. Tender palpation lateral epicondyle. Strength intact.  Pain and weakness with resisted finger and wrist extension. Pulses cap refill and sensation are intact distally.    Lab and Radiology Results Diagnostic Limited MSK Ultrasound of: Right elbow lateral Lateral epicondyle visualized.  Some hypoechoic fluid tracks superficial to the tip of the epicondyle. Additionally  small amount of hypoechoic fluid with some hyperechoic change deep insertion at lateral epicondyle.  Increased vascular activity in middle aspect of insertion at epicondyle on Doppler. 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 male with right elbow pain ongoing for months failing to improve with typical conservative management.  At this point reasonable to proceed with PRP or surgery.  After discussion with patient will proceed with PRP.  Patient recently took a BC powder which contains 1000 mg of aspirin.  We will schedule PRP for next week which should be 7 days after most recent aspirin exposure.  Discussed warning signs or symptoms.  If PRP fails will refer to hand surgery for surgical evaluation.   PDMP not reviewed this encounter. Orders Placed This Encounter  Procedures  . [place] LIMITED JOINT SPACE STRUCTURES UP RIGHT(NO LINKED CHARGES)    Standing Status:   Future    Number of Occurrences:   1    Standing Expiration Date:   08/13/2020    Order Specific Question:   Reason for Exam (SYMPTOM  OR DIAGNOSIS REQUIRED)    Answer:   Right elbow pain    Order Specific Question:   Preferred imaging location?    Answer:   [facility]   No orders of the defined types were placed in this encounter.    Discussed warning signs or symptoms. Please see discharge  instructions. Patient expresses understanding.   The above documentation has been reviewed and is accurate and complete Lynne Leader, M.D.

## 2020-02-13 NOTE — Patient Instructions (Addendum)
Thank you for coming in today.  Plan for PRP injection.  Schedule with me next week (1 week after last BC or aspirin).   I will prescribe hydrocodone during for use after the shot.  Use a wrist brace after the shot as well.   Return for PRP injection next week.

## 2020-02-20 ENCOUNTER — Ambulatory Visit: Payer: No Typology Code available for payment source | Admitting: Family Medicine

## 2020-02-22 ENCOUNTER — Encounter: Payer: Self-pay | Admitting: Family Medicine

## 2020-02-22 ENCOUNTER — Other Ambulatory Visit: Payer: Self-pay

## 2020-02-22 ENCOUNTER — Ambulatory Visit: Payer: Self-pay

## 2020-02-22 ENCOUNTER — Ambulatory Visit: Payer: No Typology Code available for payment source | Admitting: Family Medicine

## 2020-02-22 VITALS — BP 134/90 | HR 68 | Ht 69.0 in | Wt 182.6 lb

## 2020-02-22 DIAGNOSIS — M7711 Lateral epicondylitis, right elbow: Secondary | ICD-10-CM | POA: Diagnosis not present

## 2020-02-22 MED ORDER — HYDROCODONE-ACETAMINOPHEN 5-325 MG PO TABS
ORAL_TABLET | ORAL | 0 refills | Status: DC
Start: 2020-02-22 — End: 2020-02-29

## 2020-02-22 NOTE — Progress Notes (Signed)
   I, Wendy Poet, LAT, ATC, am serving as scribe for Dr. Lynne Leader.  Zachary George is a 54 y.o. male who presents to [place] at [facility] today for f/u of R elbow pain / lateral epicondylitis and for PRP of his R elbow.  He was last seen by Dr. Georgina Snell on 02/13/20 w/ a flare-up of R elbow pain after picking up his granddaughter.  Since then, pt reports  That his R elbow pain has worsened since his last visit.  He states that the pain is worse at night.     Exam:  BP 134/90 (BP Location: Right Arm, Patient Position: Sitting, Cuff Size: Normal)   Pulse 68   Ht 5\' 9"  (1.753 m)   Wt 182 lb 9.6 oz (82.8 kg)   SpO2 97%   BMI 26.97 kg/m  General: Well Developed, well nourished, and in no acute distress.   MSK: Right elbow no erythema lateral epicondyle    Lab and Radiology Results  Procedure: Real-time Ultrasound Guided Injection of right lateral epicondyle.  PRP injection. Device: Philips Affiniti 50G Images permanently stored and available for review in PACS Verbal informed consent obtained.  Discussed risks and benefits of procedure. Warned about infection bleeding damage to structures skin hypopigmentation and fat atrophy among others. Patient expresses understanding and agreement Time-out conducted.   Noted no overlying erythema, induration, or other signs of local infection.   Skin prepped in a sterile fashion.   Local anesthesia: Topical Ethyl chloride.   With sterile technique and under real time ultrasound guidance:  3 mL of Marcaine injected into lateral epicondyle subcutaneously and deep. Skin was again cleaned with isopropyl alcohol and 5 mL of leukocyte rich PRP injected throughout common extensor tendon insertion deep to superficial lateral epicondyle..Completed without difficulty   Advised to call if fevers/chills, erythema, induration, drainage, or persistent bleeding.   Images permanently stored and available for review in the ultrasound unit.    Impression: Technically successful ultrasound guided injection.         Assessment and Plan: 54 y.o. male with right lateral epicondylitis.  PRP injection as above.  Injected Marcaine prior to PRP for anesthesia.  Recommend wrist brace and prescribed hydrocodone for pain following injection.  Start advancing exercises per protocol.  Recheck as needed.   PDMP reviewed during this encounter. Orders Placed This Encounter  Procedures  . [place] LIMITED JOINT SPACE STRUCTURES UP RIGHT(NO LINKED CHARGES)    Order Specific Question:   Reason for Exam (SYMPTOM  OR DIAGNOSIS REQUIRED)    Answer:   R elbow pain    Order Specific Question:   Preferred imaging location?    Answer:   [place]   Meds ordered this encounter  Medications  . HYDROcodone-acetaminophen (NORCO) 5-325 MG tablet    Sig: 1 tabs po q6 hours prn pain    Dispense:  15 tablet    Refill:  0     Discussed warning signs or symptoms. Please see discharge instructions. Patient expresses understanding.   The above documentation has been reviewed and is accurate and complete Lynne Leader, M.D.

## 2020-02-22 NOTE — Patient Instructions (Addendum)
Thank you for coming in today.  Avoid ibuprofen or aleve for pain.  Tylenol and opiates are ok.   Please go to [facility] supply to get the wrist brace we talked about today. You may also be able to get it from [employer].   Use hydrocodone as needed.   Rechck as needed.   Star the home exercises in a few weeks when feeling better.

## 2020-02-29 ENCOUNTER — Telehealth: Payer: Self-pay | Admitting: Family Medicine

## 2020-02-29 MED ORDER — HYDROCODONE-ACETAMINOPHEN 5-325 MG PO TABS
ORAL_TABLET | ORAL | 0 refills | Status: DC
Start: 2020-02-29 — End: 2020-07-10

## 2020-02-29 NOTE — Telephone Encounter (Signed)
Called pt and relayed Dr. Clovis Riley advice regarding pain level, etc.  Pt states that in addition to the severe pain and inability to fully extend his R elbow, pt is also having a burning sensation in his R ulnar-sided hand in both the dorsal and volar hand.  He states that he has been taking 3 hydrocodone per day since the procedure and is out of this medication.  Please advise regarding a refill.  Preferred pharmacy is [facility] on [address].

## 2020-02-29 NOTE — Telephone Encounter (Signed)
We will continue to monitor over the weekend and if not improving will follow up early next week.  Hydrocodone refilled.

## 2020-02-29 NOTE — Telephone Encounter (Signed)
That is typical after PRP injection. I am deliberately trying to cause a inflammatory reaction with the PRP.  It should hurt like this probably for about 2 weeks.  I am happy to see you back in person if needed if you are concerned or worried or are very painful.

## 2020-02-29 NOTE — Telephone Encounter (Signed)
Patient called stating that he had PRP in his right elbow on 02/22/2020. It is still very painful and sometimes had a burning sensation. He has also noticed that he cannot straighten his arm out all the way and is very concerned.  Please advise.

## 2020-04-16 ENCOUNTER — Telehealth (INDEPENDENT_AMBULATORY_CARE_PROVIDER_SITE_OTHER): Payer: No Typology Code available for payment source | Admitting: Internal Medicine

## 2020-04-16 DIAGNOSIS — J320 Chronic maxillary sinusitis: Secondary | ICD-10-CM

## 2020-04-16 DIAGNOSIS — J309 Allergic rhinitis, unspecified: Secondary | ICD-10-CM | POA: Diagnosis not present

## 2020-04-16 MED ORDER — PREDNISONE 10 MG PO TABS: ORAL_TABLET | ORAL | 0 refills | Status: DC

## 2020-04-16 MED ORDER — LEVOFLOXACIN 500 MG PO TABS: 500.0000 mg | ORAL_TABLET | Freq: Every day | ORAL | 0 refills | Status: AC

## 2020-04-16 MED ORDER — HYDROCODONE-HOMATROPINE 5-1.5 MG/5ML PO SYRP: 5.0000 mL | ORAL_SOLUTION | Freq: Four times a day (QID) | ORAL | 0 refills | Status: AC | PRN

## 2020-04-16 NOTE — Progress Notes (Signed)
Patient ID: Zachary George, male   DOB: [birth year], 54 y.o.   MRN: [id number]  Virtual Visit via Video Note  I connected with Zachary Filbert Bardwell on 05/02/20 at  2:40 PM EST by a video enabled telemedicine application and verified that I am speaking with the correct person using two identifiers.  Location of all participants today Patient: at home Provider: at office   I discussed the limitations of evaluation and management by telemedicine and the availability of in person appointments. The patient expressed understanding and agreed to proceed.  History of Present Illness:  Here with 2-3 days acute onset fever, facial pain, pressure, headache, general weakness and malaise, and greenish d/c, with mild ST and cough, but pt denies chest pain, wheezing, increased sob or doe, orthopnea, PND, increased LE swelling, palpitations, dizziness or syncope.  Also, Does have several wks ongoing nasal allergy symptoms with clearish congestion, itch and sneezing, without fever, pain, ST, cough, swelling or wheezing.  No other new complaints Past Medical History:  Diagnosis Date  . Allergic rhinitis, cause unspecified 09/05/2013  . Allergy   . GERD (gastroesophageal reflux disease)   . Glaucoma   . IBS (irritable bowel syndrome)   . Left elbow pain 2011   tennis elbow   Past Surgical History:  Procedure Laterality Date  . LIGAMENT REPAIR Right 04/22/2017   Procedure: RIGHT THUMB REPAIR;  Surgeon: Leanora Cover, MD;  Location: [place];  Service: Orthopedics;  Laterality: Right;  . ULNAR COLLATERAL LIGAMENT REPAIR Right 04/22/2017   Procedure: RADIAL AND ULNAR COLLATERAL LIGAMENT REPAIR;  Surgeon: Leanora Cover, MD;  Location: [place];  Service: Orthopedics;  Laterality: Right;    reports that he has never smoked. He has never used smokeless tobacco. He reports current alcohol use. He reports that he does not use drugs. family history includes Colon cancer in his father;  Hypertension in his father. Allergies  Allergen Reactions  . Tramadol Nausea Only   Current Outpatient Medications on File Prior to Visit  Medication Sig Dispense Refill  . albuterol (VENTOLIN HFA) 108 (90 Base) MCG/ACT inhaler 2 Puff(s) By Mouth Every 4 Hours PRN 18 g 5  . Cholecalciferol (VITAMIN D3) 2000 units capsule Take 1 capsule (2,000 Units total) by mouth daily. 100 capsule 3  . cyclobenzaprine (FLEXERIL) 10 MG tablet Take 10 mg by mouth at bedtime.    . fluticasone furoate-vilanterol (BREO ELLIPTA) 200-25 MCG/INH AEPB Inhale 1 puff into the lungs every morning. 1 each 11  . HYDROcodone-acetaminophen (NORCO) 5-325 MG tablet 1 tabs po q6 hours prn pain 15 tablet 0  . ibuprofen (ADVIL,MOTRIN) 600 MG tablet Take 1 tablet (600 mg total) by mouth every 8 (eight) hours as needed for moderate pain. 90 tablet 2  . mometasone (NASONEX) 50 MCG/ACT nasal spray Two sprays in each nostril daily. 17 g 1  . montelukast (SINGULAIR) 10 MG tablet SMARTSIG:1 Tablet(s) By Mouth Every Evening    . tiZANidine (ZANAFLEX) 4 MG tablet TAKE 1 TABLET (4 MG TOTAL) BY MOUTH EVERY 8 (EIGHT) HOURS AS NEEDED FOR MUSCLE SPASMS 90 tablet 3  . triamcinolone ointment (KENALOG) 0.1 % APPLY TO AFFECTED AREA TWICE A DAY 80 g 0   No current facility-administered medications on file prior to visit.    Observations/Objective: Alert, NAD, appropriate mood and affect, resps normal, cn 2-12 intact, moves all 4s, no visible rash or swelling Lab Results  Component Value Date   WBC 7.4 12/04/2019   HGB 17.0 12/04/2019  HCT 51.4 (H) 12/04/2019   PLT 245 12/04/2019   GLUCOSE 95 12/04/2019   CHOL 263 (H) 12/04/2019   TRIG 53 12/04/2019   HDL 75 12/04/2019   LDLDIRECT 156.0 05/22/2013   LDLCALC 173 (H) 12/04/2019   ALT 33 12/04/2019   AST 23 12/04/2019   NA 140 12/04/2019   K 4.3 12/04/2019   CL 102 12/04/2019   CREATININE 0.98 12/04/2019   BUN 14 12/04/2019   CO2 30 12/04/2019   TSH 1.80 12/04/2019   PSA 1.6  12/04/2019   Assessment and Plan: See notes  Follow Up Instructions: See notes   I discussed the assessment and treatment plan with the patient. The patient was provided an opportunity to ask questions and all were answered. The patient agreed with the plan and demonstrated an understanding of the instructions.   The patient was advised to call back or seek an in-person evaluation if the symptoms worsen or if the condition fails to improve as anticipated.   Cathlean Cower, MD

## 2020-04-16 NOTE — Patient Instructions (Signed)
Please take all new medication as prescribed 

## 2020-04-17 ENCOUNTER — Telehealth: Payer: Self-pay | Admitting: *Deleted

## 2020-04-17 NOTE — Telephone Encounter (Signed)
Rec'd fax stating script for Hydrocodone-Homatropine sol that was sent in today. Med is on back order w/no release date. Requesting MD to rx something else.Marland KitchenJohny Chess

## 2020-04-18 MED ORDER — PROMETHAZINE-CODEINE 6.25-10 MG/5ML PO SYRP: 5.0000 mL | ORAL_SOLUTION | Freq: Four times a day (QID) | ORAL | 0 refills | Status: AC | PRN

## 2020-04-18 NOTE — Telephone Encounter (Signed)
Ok for phenergan with codeine instead  - done erx

## 2020-05-02 ENCOUNTER — Encounter: Payer: Self-pay | Admitting: Internal Medicine

## 2020-05-02 NOTE — Assessment & Plan Note (Signed)
Mild to mod, for predpc asd,,  to f/u any worsening symptoms or concerns

## 2020-05-02 NOTE — Assessment & Plan Note (Addendum)
Mild to mod, for antibx course,  to f/u any worsening symptoms or concerns  I spent total 18 minutes in caring for the patient for this visit:  1) by communicating with the patient and family/caregiver during the visit  2) by review of pertinent vital sign data, physical examination and labs as documented in the assessment and plan  3) by review of pertinent imaging - none today  4) by review of pertinent procedures - none today  5) by obtaining and reviewing separately obtained information from family/caretaker and Care Everywhere - none today  6) by ordering medications  8) by documenting all of this clinical information in the EHR including the management of each problem noted today in assessment and plan

## 2020-06-06 DIAGNOSIS — H608X3 Other otitis externa, bilateral: Secondary | ICD-10-CM | POA: Insufficient documentation

## 2020-07-10 ENCOUNTER — Telehealth (INDEPENDENT_AMBULATORY_CARE_PROVIDER_SITE_OTHER): Payer: BLUE CROSS/BLUE SHIELD | Admitting: Family

## 2020-07-10 DIAGNOSIS — J029 Acute pharyngitis, unspecified: Secondary | ICD-10-CM

## 2020-07-10 MED ORDER — AZITHROMYCIN 250 MG PO TABS: ORAL_TABLET | ORAL | 0 refills | Status: DC

## 2020-07-10 NOTE — Progress Notes (Signed)
Zachary George is a 55 y.o. male with the following history as recorded in [employer]:  Patient Active Problem List   Diagnosis Date Noted  . Heart murmur 12/04/2019  . Left ear pain 11/14/2018  . Colon polyp 11/14/2018  . Blood in stool 08/08/2018  . Sinusitis 05/13/2018  . Attention deficit 05/13/2018  . Wheezing 04/05/2017  . Hot flash in male 08/18/2016  . Upper airway cough syndrome 02/07/2016  . Loss of weight 01/21/2016  . Rotator cuff syndrome of right shoulder 05/07/2015  . Acromioclavicular joint arthritis 04/09/2015  . Insomnia due to stress 03/13/2015  . Allergic rhinitis 09/05/2013  . Glaucoma 05/22/2013  . Cough 05/01/2013  . Anal fissure 01/12/2013  . Hematochezia 01/12/2013  . IBS (irritable bowel syndrome) 01/12/2013  . Well adult exam 08/31/2011  . Urinary frequency 08/31/2011  . Migraine headache 08/31/2011  . GERD (gastroesophageal reflux disease) 08/31/2011  . Right hip pain 12/24/2010  . Left elbow pain 12/24/2010  . FOOT PAIN 04/07/2010  . ELBOW PAIN 08/05/2009  . Acute non-recurrent sphenoidal sinusitis 06/17/2009  . BRONCHITIS, ACUTE 06/17/2009  . TOBACCO USE, QUIT 06/17/2009  . SHOULDER PAIN, RIGHT 04/24/2009  . Lateral epicondylitis, right elbow 04/24/2009  . BUNION, RIGHT FOOT 04/24/2009  . Chest pain 04/24/2009  . KNEE PAIN 02/07/2008    Current Outpatient Medications  Medication Sig Dispense Refill  . azithromycin (ZITHROMAX) 250 MG tablet 2 tabs po qd x 1 day; 1 tablet per day x 4 days; 6 tablet 0  . Cholecalciferol (VITAMIN D3) 2000 units capsule Take 1 capsule (2,000 Units total) by mouth daily. 100 capsule 3  . cyclobenzaprine (FLEXERIL) 10 MG tablet Take 10 mg by mouth at bedtime.    Marland Kitchen albuterol (VENTOLIN HFA) 108 (90 Base) MCG/ACT inhaler 2 Puff(s) By Mouth Every 4 Hours PRN (Patient not taking: Reported on 07/10/2020) 18 g 5  . triamcinolone ointment (KENALOG) 0.1 % APPLY TO AFFECTED AREA TWICE A DAY 80 g 0   No current  facility-administered medications for this visit.    Allergies: Tramadol  Past Medical History:  Diagnosis Date  . Allergic rhinitis, cause unspecified 09/05/2013  . Allergy   . GERD (gastroesophageal reflux disease)   . Glaucoma   . IBS (irritable bowel syndrome)   . Left elbow pain 2011   tennis elbow    Past Surgical History:  Procedure Laterality Date  . LIGAMENT REPAIR Right 04/22/2017   Procedure: RIGHT THUMB REPAIR;  Surgeon: Leanora Cover, MD;  Location: [facility];  Service: Orthopedics;  Laterality: Right;  . ULNAR COLLATERAL LIGAMENT REPAIR Right 04/22/2017   Procedure: RADIAL AND ULNAR COLLATERAL LIGAMENT REPAIR;  Surgeon: Leanora Cover, MD;  Location: [facility];  Service: Orthopedics;  Laterality: Right;    Family History  Problem Relation Age of Onset  . Hypertension Father   . Colon cancer Father   . Esophageal cancer Neg Hx   . Rectal cancer Neg Hx   . Stomach cancer Neg Hx     Social History   Tobacco Use  . Smoking status: Never Smoker  . Smokeless tobacco: Never Used  Substance Use Topics  . Alcohol use: Yes    Alcohol/week: 0.0 standard drinks    Comment: rare    Subjective:    I connected with Banks Erny on 07/10/20 at 11:00 AM EST by a telephone call and verified that I am speaking with the correct person using two identifiers.   I discussed the limitations of evaluation  and management by telemedicine and the availability of in person appointments. The patient expressed understanding and agreed to proceed. Provider in office/ patient is at home; provider and patient are only 2 people on video call.   Patient complaining of 2-3 day history of sore throat/ fever/ body aches; "woke up soaked last night"; fever was 102 yesterday- today at 72; notes his wife has same symptoms; no congestion, no chest pain or cough or difficulty breathing; notes he had COVID in March 2020 and is adamant that he will NOT be getting a COVID test  at this time;    Objective:  There were no vitals filed for this visit.  Lungs: Respirations unlabored; Neurologic: Alert and oriented; speech intact;  Assessment:  1. Sore throat     Plan:  Patient refuses COVID testing; Rx for Z-pak #1 take as directed; increase fluids, rest; will need to follow up with his PCP if symptoms persist.   Time spent 11 minutes   No follow-ups on file.  No orders of the defined types were placed in this encounter.   Requested Prescriptions   Signed Prescriptions Disp Refills  . azithromycin (ZITHROMAX) 250 MG tablet 6 tablet 0    Sig: 2 tabs po qd x 1 day; 1 tablet per day x 4 days;

## 2020-08-09 DIAGNOSIS — G44219 Episodic tension-type headache, not intractable: Secondary | ICD-10-CM | POA: Insufficient documentation

## 2020-08-11 ENCOUNTER — Encounter (HOSPITAL_COMMUNITY): Payer: Self-pay | Admitting: Emergency Medicine

## 2020-08-11 ENCOUNTER — Other Ambulatory Visit: Payer: Self-pay

## 2020-08-11 ENCOUNTER — Emergency Department (HOSPITAL_COMMUNITY): Payer: BLUE CROSS/BLUE SHIELD

## 2020-08-11 ENCOUNTER — Emergency Department (HOSPITAL_COMMUNITY)
Admission: EM | Admit: 2020-08-11 | Discharge: 2020-08-11 | Disposition: A | Discharge status: Home / Self Care | Payer: BLUE CROSS/BLUE SHIELD | Attending: Emergency Medicine | Admitting: Emergency Medicine

## 2020-08-11 DIAGNOSIS — S39012A Strain of muscle, fascia and tendon of lower back, initial encounter: Secondary | ICD-10-CM

## 2020-08-11 DIAGNOSIS — M545 Low back pain, unspecified: Secondary | ICD-10-CM | POA: Insufficient documentation

## 2020-08-11 DIAGNOSIS — Y9344 Activity, trampolining: Secondary | ICD-10-CM | POA: Insufficient documentation

## 2020-08-11 DIAGNOSIS — R030 Elevated blood-pressure reading, without diagnosis of hypertension: Secondary | ICD-10-CM

## 2020-08-11 MED ORDER — HYDROMORPHONE HCL 1 MG/ML IJ SOLN
1.0000 mg | Freq: Once | INTRAMUSCULAR | Status: AC
  Administered 2020-08-11: 1 mg via INTRAMUSCULAR
  Filled 2020-08-11: qty 1

## 2020-08-11 MED ORDER — OXYCODONE-ACETAMINOPHEN 5-325 MG PO TABS: 1.0000 | ORAL_TABLET | Freq: Four times a day (QID) | ORAL | 0 refills | Status: DC | PRN

## 2020-08-11 MED ORDER — ONDANSETRON 4 MG PO TBDP
8.0000 mg | ORAL_TABLET | Freq: Once | ORAL | Status: AC
  Administered 2020-08-11: 8 mg via ORAL
  Filled 2020-08-11: qty 2

## 2020-08-11 MED ORDER — OXYCODONE-ACETAMINOPHEN 5-325 MG PO TABS
2.0000 | ORAL_TABLET | Freq: Once | ORAL | Status: AC
  Administered 2020-08-11: 2 via ORAL
  Filled 2020-08-11: qty 2

## 2020-08-11 MED ORDER — PREDNISONE 20 MG PO TABS
60.0000 mg | ORAL_TABLET | Freq: Once | ORAL | Status: AC
  Administered 2020-08-11: 60 mg via ORAL
  Filled 2020-08-11: qty 3

## 2020-08-11 MED ORDER — ONDANSETRON 8 MG PO TBDP: 8.0000 mg | ORAL_TABLET | Freq: Three times a day (TID) | ORAL | 0 refills | Status: DC | PRN

## 2020-08-11 MED ORDER — PREDNISONE 20 MG PO TABS: ORAL_TABLET | ORAL | 0 refills | Status: AC

## 2020-08-11 NOTE — ED Triage Notes (Signed)
C/o lower back pain since jumping on trampoline Friday.

## 2020-08-11 NOTE — Discharge Instructions (Addendum)
It was our pleasure to provide your ER care today - we hope that you feel better.  Rest. Avoid bending at waist, or heavy lifting > 20 lbs for the next week. Try to find position of comfort - lying on back with pillow under knees, or on side with pillow between knees.   Try heat therapy to sore area.   Take prednisone as prescribed. You may also take percocet as need for pain. No driving for the next 8 hours or when taking percocet. Also, do not take tylenol or acetaminophen containing medication when taking percocet. Take zofran as need if nauseated.   Follow up with primary care doctor in one week if symptoms fail to improve/resolve.  Also follow up with primary care doctor for recheck of blood pressure which is high today.  Return to ER if worse, new symptoms, fevers, intractable pain, numbness/weakness, problems w bowel or bladder function or other concern.   You were given pain medication in the ER - no driving for the next 8 hours, or if/when taking the percocet medication.

## 2020-08-11 NOTE — ED Provider Notes (Signed)
[facility] EMERGENCY DEPARTMENT Provider Note   CSN: [id number] Arrival date & time: 08/11/20  1302     History Chief Complaint  Patient presents with  . back pain    Klark Stull is a 55 y.o. male.  Patient c/o severe lower back pain. Symptoms acute onset in past two days. States two days ago was jumping on trampoline with grandkid, when felt may have tweeked or strained back. Following two days, pain constant, lower back sl worse on right, dull, radiates towards r buttock but not down leg, constant, worse w movement, bending or position change. No associated numbness/weakness. No problems w normal bowel or bladder function. No fever or chills. No hx ddd. No prior back surgery. No anterior pain, no abd or pelvic pain. Denies other pain or injury. Took ibuprofen and muscle relaxer without relief.   The history is provided by the patient.       Past Medical History:  Diagnosis Date  . Allergic rhinitis, cause unspecified 09/05/2013  . Allergy   . GERD (gastroesophageal reflux disease)   . Glaucoma   . IBS (irritable bowel syndrome)   . Left elbow pain 2011   tennis elbow    Patient Active Problem List   Diagnosis Date Noted  . Heart murmur 12/04/2019  . Left ear pain 11/14/2018  . Colon polyp 11/14/2018  . Blood in stool 08/08/2018  . Sinusitis 05/13/2018  . Attention deficit 05/13/2018  . Wheezing 04/05/2017  . Hot flash in male 08/18/2016  . Upper airway cough syndrome 02/07/2016  . Loss of weight 01/21/2016  . Rotator cuff syndrome of right shoulder 05/07/2015  . Acromioclavicular joint arthritis 04/09/2015  . Insomnia due to stress 03/13/2015  . Allergic rhinitis 09/05/2013  . Glaucoma 05/22/2013  . Cough 05/01/2013  . Anal fissure 01/12/2013  . Hematochezia 01/12/2013  . IBS (irritable bowel syndrome) 01/12/2013  . Well adult exam 08/31/2011  . Urinary frequency 08/31/2011  . Migraine headache 08/31/2011  . GERD (gastroesophageal reflux  disease) 08/31/2011  . Right hip pain 12/24/2010  . Left elbow pain 12/24/2010  . FOOT PAIN 04/07/2010  . ELBOW PAIN 08/05/2009  . Acute non-recurrent sphenoidal sinusitis 06/17/2009  . BRONCHITIS, ACUTE 06/17/2009  . TOBACCO USE, QUIT 06/17/2009  . SHOULDER PAIN, RIGHT 04/24/2009  . Lateral epicondylitis, right elbow 04/24/2009  . BUNION, RIGHT FOOT 04/24/2009  . Chest pain 04/24/2009  . KNEE PAIN 02/07/2008    Past Surgical History:  Procedure Laterality Date  . LIGAMENT REPAIR Right 04/22/2017   Procedure: RIGHT THUMB REPAIR;  Surgeon: Leanora Cover, MD;  Location: [facility];  Service: Orthopedics;  Laterality: Right;  . ULNAR COLLATERAL LIGAMENT REPAIR Right 04/22/2017   Procedure: RADIAL AND ULNAR COLLATERAL LIGAMENT REPAIR;  Surgeon: Leanora Cover, MD;  Location: [facility];  Service: Orthopedics;  Laterality: Right;       Family History  Problem Relation Age of Onset  . Hypertension Father   . Colon cancer Father   . Esophageal cancer Neg Hx   . Rectal cancer Neg Hx   . Stomach cancer Neg Hx     Social History   Tobacco Use  . Smoking status: Never Smoker  . Smokeless tobacco: Never Used  Substance Use Topics  . Alcohol use: Yes    Alcohol/week: 0.0 standard drinks    Comment: rare  . Drug use: No    Home Medications Prior to Admission medications   Medication Sig Start Date End Date  Taking? Authorizing Provider  albuterol (VENTOLIN HFA) 108 (90 Base) MCG/ACT inhaler 2 Puff(s) By Mouth Every 4 Hours PRN Patient not taking: Reported on 07/10/2020 12/04/19   Plotnikov, Evie Lacks, MD  azithromycin (ZITHROMAX) 250 MG tablet 2 tabs po qd x 1 day; 1 tablet per day x 4 days; 07/10/20   Marrian Salvage, FNP  Cholecalciferol (VITAMIN D3) 2000 units capsule Take 1 capsule (2,000 Units total) by mouth daily. 07/21/17   Plotnikov, Evie Lacks, MD  cyclobenzaprine (FLEXERIL) 10 MG tablet Take 10 mg by mouth at bedtime. 11/19/19   [provider]  triamcinolone ointment (KENALOG) 0.1 % APPLY TO AFFECTED AREA TWICE A DAY 05/03/19   Plotnikov, Evie Lacks, MD    Allergies    Tramadol  Review of Systems   Review of Systems  Constitutional: Negative for chills and fever.  Gastrointestinal: Negative for abdominal pain, nausea and vomiting.  Genitourinary: Negative for difficulty urinating.  Musculoskeletal: Positive for back pain. Negative for neck pain.  Neurological: Negative for weakness and numbness.    Physical Exam Updated Vital Signs BP (!) 152/97   Pulse 83   Temp 98.4 F (36.9 C)   Resp (!) 22   SpO2 99%   Physical Exam Vitals and nursing note reviewed.  Constitutional:      Appearance: Normal appearance. He is well-developed.  HENT:     Head: Atraumatic.     Nose: Nose normal.     Mouth/Throat:     Mouth: Mucous membranes are moist.  Eyes:     General: No scleral icterus.    Conjunctiva/sclera: Conjunctivae normal.  Neck:     Trachea: No tracheal deviation.  Cardiovascular:     Rate and Rhythm: Normal rate.     Pulses: Normal pulses.  Pulmonary:     Effort: Pulmonary effort is normal. No accessory muscle usage or respiratory distress.  Abdominal:     General: There is no distension.     Tenderness: There is no abdominal tenderness.  Genitourinary:    Comments: No cva tenderness. Musculoskeletal:        General: No swelling.     Cervical back: Neck supple. No tenderness.     Comments: Lumbar tenderness, otherwise, CTLS spine, non tender, aligned, no step off. Good rom bil lower ext without pain or focal bony tenderness. Distal pulses palp.   Skin:    General: Skin is warm and dry.     Findings: No rash.  Neurological:     Mental Status: He is alert.     Comments: Alert, speech clear. Straight leg raise neg. Motor intact bil lower ext, stre 5/5. Sens grossly intact. Reflexes intact.   Psychiatric:        Mood and Affect: Mood normal.     ED Results / Procedures / Treatments    Labs (all labs ordered are listed, but only abnormal results are displayed) Labs Reviewed - No data to display  EKG None  Radiology DG Lumbar Spine Complete  Result Date: 08/11/2020 CLINICAL DATA:  Low back pain after jumping on a trampoline two days ago. EXAM: LUMBAR SPINE - COMPLETE 4+ VIEW COMPARISON:  None. FINDINGS: Five non-rib-bearing lumbar vertebrae. Minimal anterior spur formation at the L2-3 and L3-4 levels. No fractures, pars defects or subluxations. IMPRESSION: 1. No fracture or subluxation. 2. Minimal degenerative changes. Electronically Signed   By: Claudie Revering M.D.   On: 08/11/2020 16:36    Procedures Procedures   Medications Ordered in ED Medications  oxyCODONE-acetaminophen (PERCOCET/ROXICET) 5-325 MG per tablet 2 tablet (has no administration in time range)  predniSONE (DELTASONE) tablet 60 mg (has no administration in time range)  ondansetron (ZOFRAN-ODT) disintegrating tablet 8 mg (has no administration in time range)    ED Course  I have reviewed the triage vital signs and the nursing notes.  Pertinent labs & imaging results that were available during my care of the patient were reviewed by me and considered in my medical decision making (see chart for details).    MDM Rules/Calculators/A&P                          Xrays.  Prednisone po. Percocet po.   Reviewed nursing notes and prior charts for additional history.   Xrays reviewed/interpreted by me - no fx.   Pain persists. Pt requests additional med. Dilaudid 1 mg im.    Recheck pain improved. Pt ambulatory to bathroom. No numbness/weakness.   RX pred/perc for home. Rest.   Rec pcp f/u.  Return precautions provided.    Final Clinical Impression(s) / ED Diagnoses Final diagnoses:  None    Rx / DC Orders ED Discharge Orders    None       Lajean Saver, MD 08/11/20 [phone]

## 2020-08-13 ENCOUNTER — Other Ambulatory Visit: Payer: Self-pay

## 2020-08-14 ENCOUNTER — Ambulatory Visit (INDEPENDENT_AMBULATORY_CARE_PROVIDER_SITE_OTHER): Payer: BLUE CROSS/BLUE SHIELD | Admitting: Internal Medicine

## 2020-08-14 ENCOUNTER — Encounter: Payer: Self-pay | Admitting: Internal Medicine

## 2020-08-14 DIAGNOSIS — M5410 Radiculopathy, site unspecified: Secondary | ICD-10-CM | POA: Diagnosis not present

## 2020-08-14 MED ORDER — CYCLOBENZAPRINE HCL 10 MG PO TABS: 10.0000 mg | ORAL_TABLET | Freq: Every evening | ORAL | 1 refills | Status: DC | PRN

## 2020-08-14 MED ORDER — OXYCODONE-ACETAMINOPHEN 10-325 MG PO TABS: 1.0000 | ORAL_TABLET | Freq: Three times a day (TID) | ORAL | 0 refills | Status: AC | PRN

## 2020-08-14 NOTE — Progress Notes (Signed)
Subjective:  Patient ID: Zachary George, male    DOB: [birth year]  Age: 55 y.o. MRN: [id number]  CC: Follow-up (ER F/U)   HPI Zachary George presents for LBP Pain started on Fri - R LS - severe Pt went to ER on Sun by ambulance; extreme pain - X rays w/LS OA. He was jumping on a trampoline prior (w/his GD) On Prednisone and Percocet  Outpatient Medications Prior to Visit  Medication Sig Dispense Refill  . Cholecalciferol (VITAMIN D3) 2000 units capsule Take 1 capsule (2,000 Units total) by mouth daily. 100 capsule 3  . cyclobenzaprine (FLEXERIL) 10 MG tablet Take 10 mg by mouth at bedtime.    Marland Kitchen oxyCODONE-acetaminophen (PERCOCET/ROXICET) 5-325 MG tablet Take 1-2 tablets by mouth every 6 (six) hours as needed for severe pain. 20 tablet 0  . predniSONE (DELTASONE) 20 MG tablet 3 po once a day for 2 days, then 2 po once a day for 3 days, then 1 po once a day for 3 days 15 tablet 0  . triamcinolone ointment (KENALOG) 0.1 % APPLY TO AFFECTED AREA TWICE A DAY 80 g 0  . albuterol (VENTOLIN HFA) 108 (90 Base) MCG/ACT inhaler 2 Puff(s) By Mouth Every 4 Hours PRN (Patient not taking: No sig reported) 18 g 5  . azithromycin (ZITHROMAX) 250 MG tablet 2 tabs po qd x 1 day; 1 tablet per day x 4 days; 6 tablet 0  . ondansetron (ZOFRAN ODT) 8 MG disintegrating tablet Take 1 tablet (8 mg total) by mouth every 8 (eight) hours as needed for nausea or vomiting. (Patient not taking: Reported on 08/14/2020) 10 tablet 0   No facility-administered medications prior to visit.    ROS: Review of Systems  Constitutional: Negative for appetite change, fatigue and unexpected weight change.  HENT: Negative for congestion, nosebleeds, sneezing, sore throat and trouble swallowing.   Eyes: Negative for itching and visual disturbance.  Respiratory: Negative for cough.   Cardiovascular: Negative for chest pain, palpitations and leg swelling.  Gastrointestinal: Negative for abdominal distention, blood in stool, diarrhea and  nausea.  Genitourinary: Negative for frequency and hematuria.  Musculoskeletal: Positive for back pain and gait problem. Negative for joint swelling and neck pain.  Skin: Negative for rash.  Neurological: Negative for dizziness, tremors, speech difficulty and weakness.  Psychiatric/Behavioral: Negative for agitation, dysphoric mood and sleep disturbance. The patient is not nervous/anxious.     Objective:  BP (!) 142/98 (BP Location: Left Arm)   Pulse 72   Temp 98.1 F (36.7 C) (Oral)   Ht 5\' 9"  (1.753 m)   Wt 202 lb (91.6 kg)   SpO2 97%   BMI 29.83 kg/m   BP Readings from Last 3 Encounters:  08/14/20 (!) 142/98  08/11/20 (!) 154/103  02/22/20 134/90    Wt Readings from Last 3 Encounters:  08/14/20 202 lb (91.6 kg)  02/22/20 182 lb 9.6 oz (82.8 kg)  02/13/20 183 lb (83 kg)    Physical Exam Constitutional:      General: He is not in acute distress.    Appearance: He is well-developed.     Comments: NAD  Eyes:     Conjunctiva/sclera: Conjunctivae normal.     Pupils: Pupils are equal, round, and reactive to light.  Neck:     Thyroid: No thyromegaly.     Vascular: No JVD.  Cardiovascular:     Rate and Rhythm: Normal rate and regular rhythm.     Heart sounds: Normal heart sounds. No murmur heard.  No friction rub. No gallop.   Pulmonary:     Effort: Pulmonary effort is normal. No respiratory distress.     Breath sounds: Normal breath sounds. No wheezing or rales.  Chest:     Chest wall: No tenderness.  Abdominal:     General: Bowel sounds are normal. There is no distension.     Palpations: Abdomen is soft. There is no mass.     Tenderness: There is no abdominal tenderness. There is no guarding or rebound.  Musculoskeletal:        General: Tenderness present. Normal range of motion.     Cervical back: Normal range of motion.  Lymphadenopathy:     Cervical: No cervical adenopathy.  Skin:    General: Skin is warm and dry.     Findings: No rash.  Neurological:      Mental Status: He is alert and oriented to person, place, and time.     Cranial Nerves: No cranial nerve deficit.     Motor: No abnormal muscle tone.     Coordination: Coordination abnormal.     Gait: Gait abnormal.     Deep Tendon Reflexes: Reflexes are normal and symmetric.  Psychiatric:        Behavior: Behavior normal.        Thought Content: Thought content normal.        Judgment: Judgment normal.   Severe LS spine pain w/ROM Using a cane Str leg elev (+) R. DTR s ok  Lab Results  Component Value Date   WBC 7.4 12/04/2019   HGB 17.0 12/04/2019   HCT 51.4 (H) 12/04/2019   PLT 245 12/04/2019   GLUCOSE 95 12/04/2019   CHOL 263 (H) 12/04/2019   TRIG 53 12/04/2019   HDL 75 12/04/2019   LDLDIRECT 156.0 05/22/2013   LDLCALC 173 (H) 12/04/2019   ALT 33 12/04/2019   AST 23 12/04/2019   NA 140 12/04/2019   K 4.3 12/04/2019   CL 102 12/04/2019   CREATININE 0.98 12/04/2019   BUN 14 12/04/2019   CO2 30 12/04/2019   TSH 1.80 12/04/2019   PSA 1.6 12/04/2019    DG Lumbar Spine Complete  Result Date: 08/11/2020 CLINICAL DATA:  Low back pain after jumping on a trampoline two days ago. EXAM: LUMBAR SPINE - COMPLETE 4+ VIEW COMPARISON:  None. FINDINGS: Five non-rib-bearing lumbar vertebrae. Minimal anterior spur formation at the L2-3 and L3-4 levels. No fractures, pars defects or subluxations. IMPRESSION: 1. No fracture or subluxation. 2. Minimal degenerative changes. Electronically Signed   By: Claudie Revering M.D.   On: 08/11/2020 16:36    Assessment & Plan:    Walker Kehr, MD

## 2020-08-14 NOTE — Assessment & Plan Note (Addendum)
Cont Percocet w/LOC Finish Prednisone PT offered MRI LS spine

## 2020-08-22 ENCOUNTER — Other Ambulatory Visit: Payer: BLUE CROSS/BLUE SHIELD

## 2020-08-26 ENCOUNTER — Telehealth: Payer: Self-pay | Admitting: Internal Medicine

## 2020-08-26 MED ORDER — OXYCODONE-ACETAMINOPHEN 5-325 MG PO TABS: 1.0000 | ORAL_TABLET | Freq: Four times a day (QID) | ORAL | 0 refills | Status: AC | PRN

## 2020-08-26 NOTE — Telephone Encounter (Signed)
Check [place] registry last filled 08/14/2020..lmb

## 2020-08-26 NOTE — Telephone Encounter (Signed)
Done. Thx.

## 2020-08-26 NOTE — Telephone Encounter (Signed)
1.Medication Requested: oxyCODONE-acetaminophen (PERCOCET) 10-325 MG tablet    2. Pharmacy (Name, Street, [place]): [id number] - [address], [place].  3. On Med List: no   4. Last Visit with PCP: 08-14-20  5. Next visit date with PCP: n/a    Patient called and said that his MRI is a few days away and he is having a hard time sleeping and walking. He also said that the pain has moved to down his legs. Please advise    Agent: Please be advised that RX refills may take up to 3 business days. We ask that you follow-up with your pharmacy.

## 2020-10-05 ENCOUNTER — Other Ambulatory Visit: Payer: Self-pay | Admitting: Internal Medicine

## 2020-10-10 ENCOUNTER — Other Ambulatory Visit: Payer: Self-pay

## 2020-10-10 ENCOUNTER — Encounter: Payer: Self-pay | Admitting: Internal Medicine

## 2020-10-10 ENCOUNTER — Telehealth: Payer: Self-pay | Admitting: Internal Medicine

## 2020-10-10 ENCOUNTER — Ambulatory Visit (INDEPENDENT_AMBULATORY_CARE_PROVIDER_SITE_OTHER): Payer: BLUE CROSS/BLUE SHIELD | Admitting: Internal Medicine

## 2020-10-10 DIAGNOSIS — I10 Essential (primary) hypertension: Secondary | ICD-10-CM

## 2020-10-10 DIAGNOSIS — R42 Dizziness and giddiness: Secondary | ICD-10-CM | POA: Diagnosis not present

## 2020-10-10 DIAGNOSIS — F419 Anxiety disorder, unspecified: Secondary | ICD-10-CM | POA: Diagnosis not present

## 2020-10-10 NOTE — Patient Instructions (Signed)
Ok to change the Lisinopril HCT 20 -25 mg to:  Lisinopril 40 mg - HALF per day to start with, but ok to take the whole pill (40 mg) if you feel better and the BP is still more than 130/80  Please continue all other medications as before, and refills have been done if requested.  Please have the pharmacy call with any other refills you may need.  Please keep your appointments with your specialists as you may have planned

## 2020-10-10 NOTE — Progress Notes (Signed)
Patient ID: Zachary George, male   DOB: [birth year], 55 y.o.   MRN: [id number]        Chief Complaint: dizzy with standing       HPI:  Zachary George is a 55 y.o. male here with c/o dizziness with standing worsening in the past wk, despite trying to keep up with fluids and good compliance with med; Pt denies chest pain, increased sob or doe, wheezing, orthopnea, PND, increased LE swelling, palpitations, vertigo or syncope.   Pt denies polydipsia, polyuria, or new focal neuro s/s.   Pt denies fever, wt loss, night sweats, loss of appetite, or other constitutional symptoms  Denies worsening depressive symptoms, suicidal ideation, or panic; has ongoing anxiety       Wt Readings from Last 3 Encounters:  10/10/20 195 lb (88.5 kg)  08/14/20 202 lb (91.6 kg)  02/22/20 182 lb 9.6 oz (82.8 kg)   BP Readings from Last 3 Encounters:  10/10/20 124/78  08/14/20 (!) 142/98  08/11/20 (!) 154/103         Past Medical History:  Diagnosis Date   Allergic rhinitis, cause unspecified 09/05/2013   Allergy    GERD (gastroesophageal reflux disease)    Glaucoma    IBS (irritable bowel syndrome)    Left elbow pain 2011   tennis elbow   Past Surgical History:  Procedure Laterality Date   LIGAMENT REPAIR Right 04/22/2017   Procedure: RIGHT THUMB REPAIR;  Surgeon: Leanora Cover, MD;  Location: [place];  Service: Orthopedics;  Laterality: Right;   ULNAR COLLATERAL LIGAMENT REPAIR Right 04/22/2017   Procedure: RADIAL AND ULNAR COLLATERAL LIGAMENT REPAIR;  Surgeon: Leanora Cover, MD;  Location: [place];  Service: Orthopedics;  Laterality: Right;    reports that he has never smoked. He has never used smokeless tobacco. He reports current alcohol use. He reports that he does not use drugs. family history includes Colon cancer in his father; Hypertension in his father. Allergies  Allergen Reactions   Tramadol Nausea Only   Current Outpatient Medications on File Prior to Visit   Medication Sig Dispense Refill   Cholecalciferol (VITAMIN D3) 2000 units capsule Take 1 capsule (2,000 Units total) by mouth daily. 100 capsule 3   cyclobenzaprine (FLEXERIL) 10 MG tablet TAKE 1 TABLET BY MOUTH AT BEDTIME AS NEEDED FOR MUSCLE SPASMS 30 tablet 1   mometasone (ELOCON) 0.1 % cream Apply to the affected area twice a day for up to 5 days as needed for itching     albuterol (VENTOLIN HFA) 108 (90 Base) MCG/ACT inhaler 2 Puff(s) By Mouth Every 4 Hours PRN (Patient not taking: No sig reported) 18 g 5   Diclofenac Sodium CR 100 MG 24 hr tablet Take by mouth.     HYDROcodone-acetaminophen (NORCO) 10-325 MG tablet Take 1 tablet by mouth every 6 (six) hours as needed.     latanoprost (XALATAN) 0.005 % ophthalmic solution 1 drop at bedtime.     oxyCODONE-acetaminophen (PERCOCET/ROXICET) 5-325 MG tablet Take 1 tablet by mouth every 6 (six) hours as needed for severe pain. (Patient not taking: Reported on 10/10/2020) 20 tablet 0   predniSONE (DELTASONE) 20 MG tablet 3 po once a day for 2 days, then 2 po once a day for 3 days, then 1 po once a day for 3 days (Patient not taking: Reported on 10/10/2020) 15 tablet 0   rizatriptan (MAXALT) 10 MG tablet Take by mouth.     triamcinolone ointment (KENALOG) 0.1 % APPLY TO  AFFECTED AREA TWICE A DAY 80 g 0   No current facility-administered medications on file prior to visit.        ROS:  All others reviewed and negative.  Objective        PE:  BP 124/78 (BP Location: Left Arm, Patient Position: Sitting, Cuff Size: Large)   Pulse 96   Ht 5\' 9"  (1.753 m)   Wt 195 lb (88.5 kg)   SpO2 96%   BMI 28.80 kg/m                 Constitutional: Pt appears in NAD               HENT: Head: NCAT.                Right Ear: External ear normal.                 Left Ear: External ear normal.                Eyes: . Pupils are equal, round, and reactive to light. Conjunctivae and EOM are normal               Nose: without d/c or deformity               Neck:  Neck supple. Gross normal ROM               Cardiovascular: Normal rate and regular rhythm.                 Pulmonary/Chest: Effort normal and breath sounds without rales or wheezing.                Abd:  Soft, NT, ND, + BS, no organomegaly               Neurological: Pt is alert. At baseline orientation, motor grossly intact               Skin: Skin is warm. No rashes, no other new lesions, LE edema - none               Psychiatric: Pt behavior is normal without agitation , nervous  Micro: none  Cardiac tracings I have personally interpreted today:  none  Pertinent Radiological findings (summarize): none   Lab Results  Component Value Date   WBC 7.4 12/04/2019   HGB 17.0 12/04/2019   HCT 51.4 (H) 12/04/2019   PLT 245 12/04/2019   GLUCOSE 95 12/04/2019   CHOL 263 (H) 12/04/2019   TRIG 53 12/04/2019   HDL 75 12/04/2019   LDLDIRECT 156.0 05/22/2013   LDLCALC 173 (H) 12/04/2019   ALT 33 12/04/2019   AST 23 12/04/2019   NA 140 12/04/2019   K 4.3 12/04/2019   CL 102 12/04/2019   CREATININE 0.98 12/04/2019   BUN 14 12/04/2019   CO2 30 12/04/2019   TSH 1.80 12/04/2019   PSA 1.6 12/04/2019   Assessment/Plan:  Zachary George is a 55 y.o. White or Caucasian [1] male with  has a past medical history of Allergic rhinitis, cause unspecified (09/05/2013), Allergy, GERD (gastroesophageal reflux disease), Glaucoma, IBS (irritable bowel syndrome), and Left elbow pain (2011).  Dizziness Possibly orthostatic by hx, ok for d/c hct  HTN (hypertension) BP likely for worsening control with d/c hct, will increase the lisionpril to 40 qd,  to f/u any worsening symptoms or concerns  Anxiety Chronic stable, declines need for change in tx  Followup: Return if  symptoms worsen or fail to improve.  Cathlean Cower, MD 10/13/2020 7:51 PM [facility]

## 2020-10-10 NOTE — Telephone Encounter (Signed)
    Patient calling for status for new Lisinopril rx

## 2020-10-11 MED ORDER — LISINOPRIL 20 MG PO TABS: 20.0000 mg | ORAL_TABLET | Freq: Every day | ORAL | 3 refills | Status: DC

## 2020-10-11 MED ORDER — LISINOPRIL 40 MG PO TABS: 40.0000 mg | ORAL_TABLET | Freq: Every day | ORAL | 3 refills | Status: AC

## 2020-10-11 NOTE — Telephone Encounter (Signed)
Sorry, the lisionpril 40 mg only was just sent (to take instead of the lisionpril HCT)

## 2020-10-11 NOTE — Telephone Encounter (Signed)
Pt notified of Dr. Jenny Reichmann last note

## 2020-10-13 ENCOUNTER — Encounter: Payer: Self-pay | Admitting: Internal Medicine

## 2020-10-13 DIAGNOSIS — F419 Anxiety disorder, unspecified: Secondary | ICD-10-CM | POA: Insufficient documentation

## 2020-10-13 NOTE — Assessment & Plan Note (Signed)
BP likely for worsening control with d/c hct, will increase the lisionpril to 40 qd,  to f/u any worsening symptoms or concerns

## 2020-10-13 NOTE — Assessment & Plan Note (Signed)
Chronic stable, declines need for change in tx

## 2020-10-13 NOTE — Assessment & Plan Note (Signed)
Possibly orthostatic by hx, ok for d/c hct

## 2021-02-25 ENCOUNTER — Telehealth: Payer: Self-pay | Admitting: Internal Medicine

## 2021-02-25 NOTE — Telephone Encounter (Signed)
Patient requesting a refill on oxyCODONE-acetaminophen (PERCOCET/ROXICET) 5-325 MG tablet  Patient's was last seen 10-10-2020 by Dr. Jenny Reichmann  Patient is not scheduled for a future appt  Pharmacy [id number] - [place], [place].

## 2021-02-26 NOTE — Telephone Encounter (Signed)
Called pt there was no answer LMOM refill denied . Will need ov for re-evaluation.Marland KitchenJohny George

## 2021-02-26 NOTE — Telephone Encounter (Signed)
Pls sch OV Thx 

## 2021-09-19 ENCOUNTER — Encounter: Payer: Self-pay | Admitting: Internal Medicine
